# Patient Record
Sex: Male | Born: 1990 | Hispanic: Yes | Marital: Single | Smoking: Former smoker
Health system: Southern US, Community
[De-identification: ages and names within clinical notes are randomized; demographics above are authoritative.]

## PROBLEM LIST (undated history)

## (undated) HISTORY — PX: EYE SURGERY: SHX253

---
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
## 2011-11-10 ENCOUNTER — Emergency Department: Payer: Self-pay | Admitting: *Deleted

## 2011-11-10 LAB — URINALYSIS, COMPLETE
Bilirubin,UR: NEGATIVE
Glucose,UR: NEGATIVE mg/dL (ref 0–75)
Ketone: NEGATIVE
Protein: NEGATIVE
Specific Gravity: 1.027 (ref 1.003–1.030)
Squamous Epithelial: NONE SEEN
WBC UR: <1 /HPF (ref 0–5)

## 2011-11-10 LAB — CBC
MCH: 29.9 pg (ref 26.0–34.0)
MCHC: 33.4 g/dL (ref 32.0–36.0)
MCV: 89 fL (ref 80–100)
Platelet: 159 10*3/uL (ref 150–440)
RBC: 4.65 10*6/uL (ref 4.40–5.90)
RDW: 12.4 % (ref 11.5–14.5)
WBC: 6.3 10*3/uL (ref 3.8–10.6)

## 2011-11-10 LAB — COMPREHENSIVE METABOLIC PANEL
Albumin: 4.8 g/dL (ref 3.4–5.0)
BUN: 12 mg/dL (ref 7–18)
Calcium, Total: 8.8 mg/dL (ref 8.5–10.1)
Co2: 29 mmol/L (ref 21–32)
Creatinine: 0.75 mg/dL (ref 0.60–1.30)
EGFR (Non-African Amer.): >60
Osmolality: 280 (ref 275–301)
Potassium: 3.4 mmol/L — ABNORMAL LOW (ref 3.5–5.1)
SGOT(AST): 20 U/L (ref 15–37)
SGPT (ALT): 20 U/L

## 2011-11-10 LAB — LIPASE, BLOOD: Lipase: 92 U/L (ref 73–393)

## 2011-11-28 LAB — HM HIV SCREENING LAB: HM HIV Screening: NEGATIVE

## 2013-10-01 IMAGING — CT CT ABD-PELV W/ CM
1 of 2 series · 15 of 32 positions shown, 19 images · non-contrast
Comparison: none

REASON FOR EXAM: (1) RLQ pain, n/v; (2) RLQ pain, n/v
COMMENTS:

[Series 2: 3mm soft tissue · axial · 0.66mm/px · z∈[-764,-302]mm · 15 of 170 slices shown, 19 images]
[im 8/170  soft-tissue]
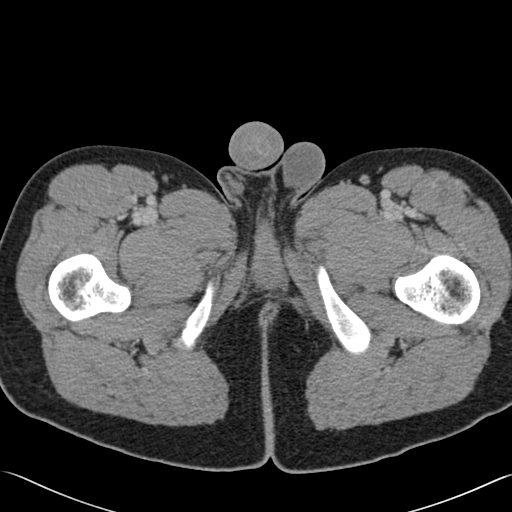
[im 8/170  bone]
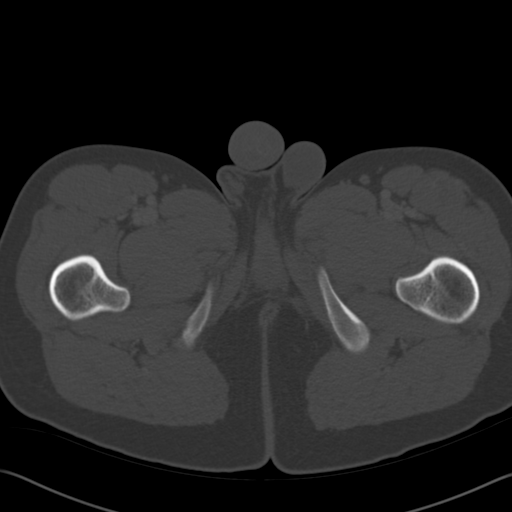
[im 22/170  soft-tissue]
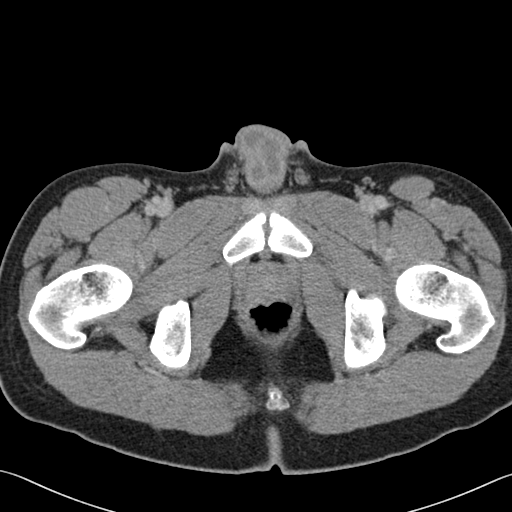
[im 36/170  soft-tissue]
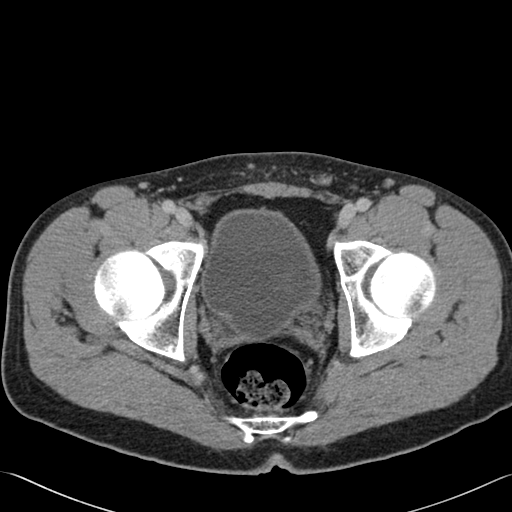
[im 50/170  soft-tissue]
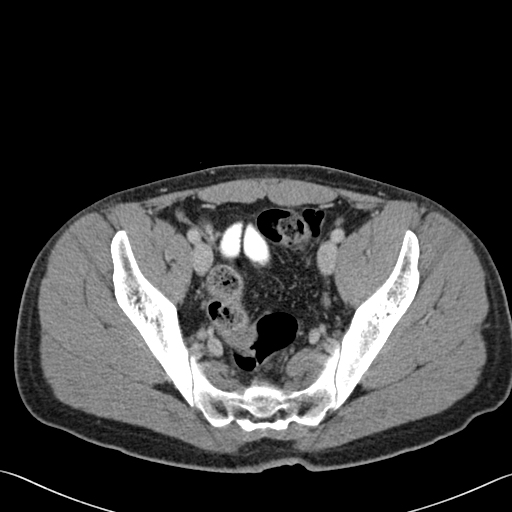
[im 57/170  soft-tissue]
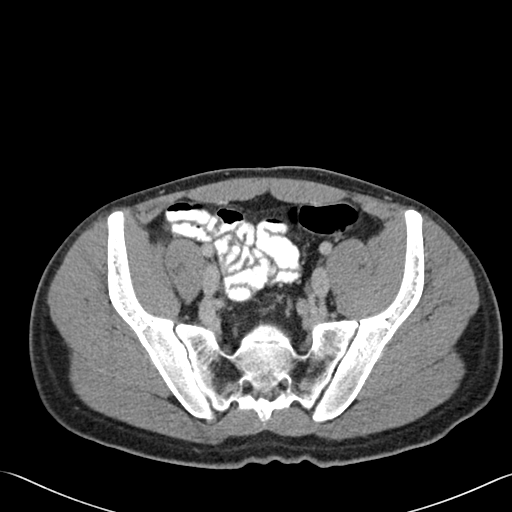
[im 71/170  soft-tissue]
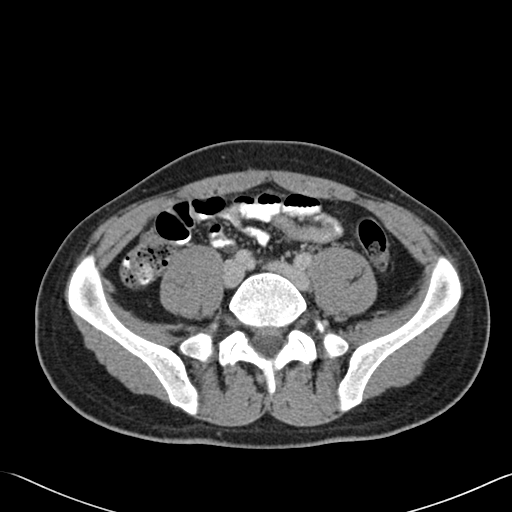
[im 85/170  soft-tissue]
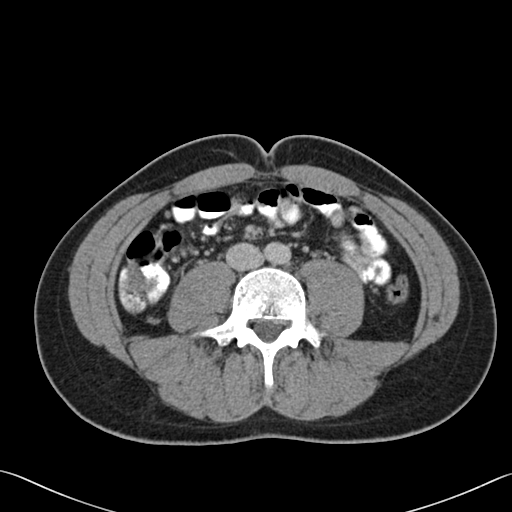
[im 99/170  soft-tissue]
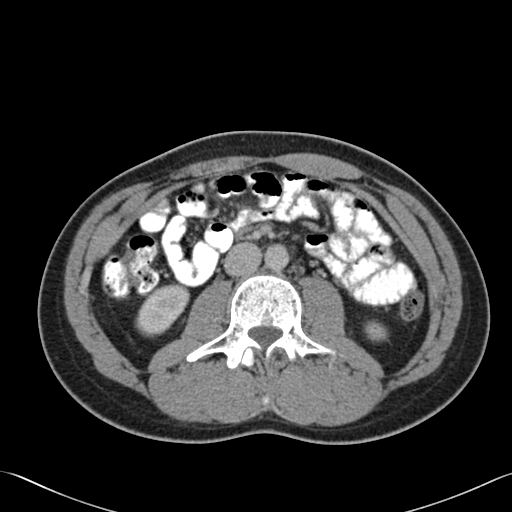
[im 113/170  soft-tissue]
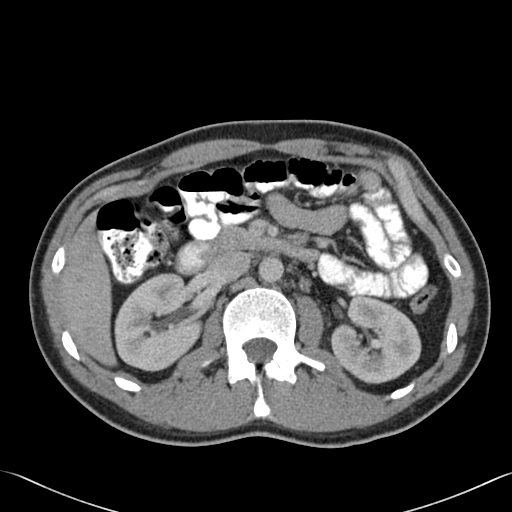
[im 113/170  bone]
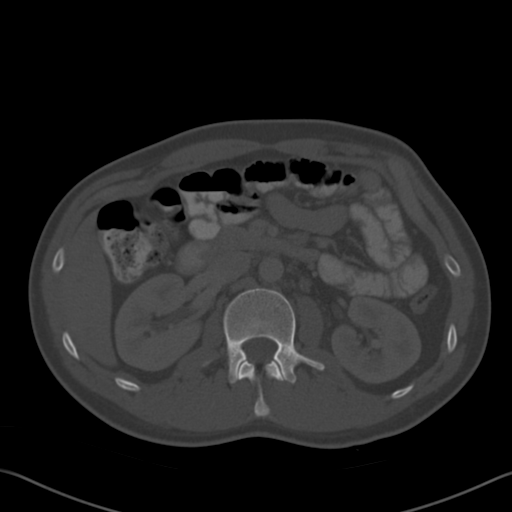
[im 120/170  soft-tissue]
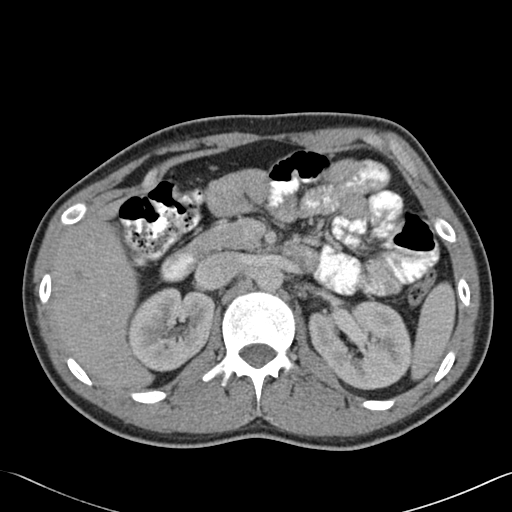
[im 134/170  soft-tissue]
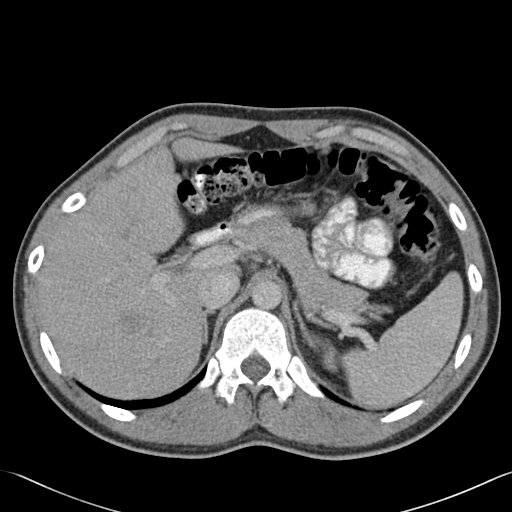
[im 141/170  lung]
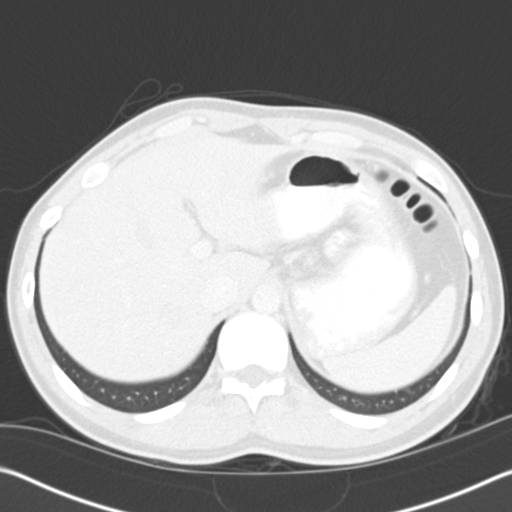
[im 148/170  soft-tissue]
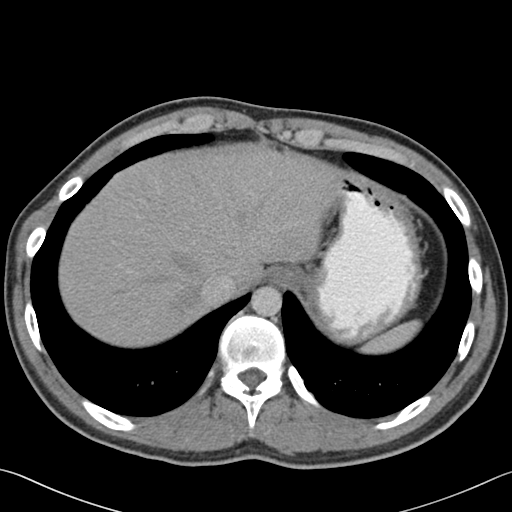
[im 148/170  lung]
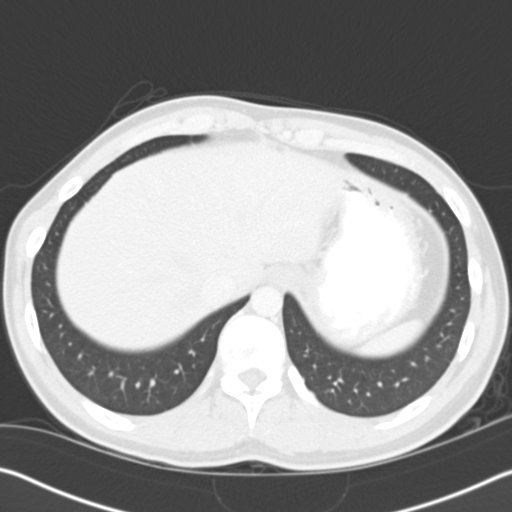
[im 155/170  lung]
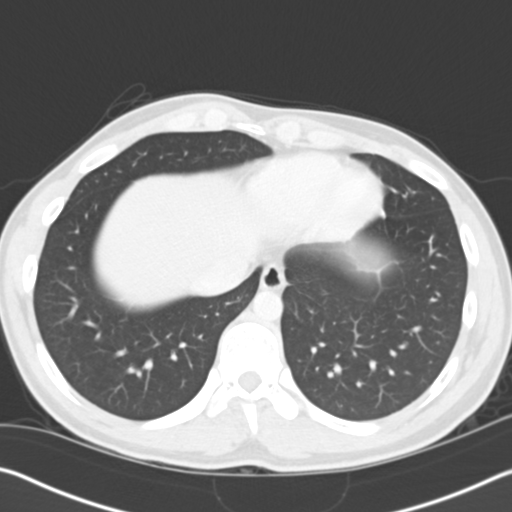
[im 162/170  soft-tissue]
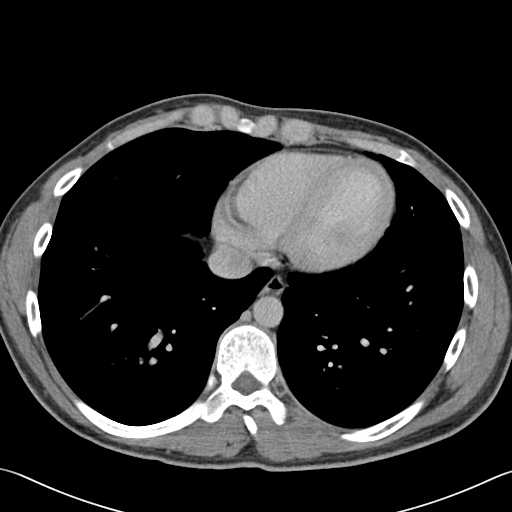
[im 162/170  lung]
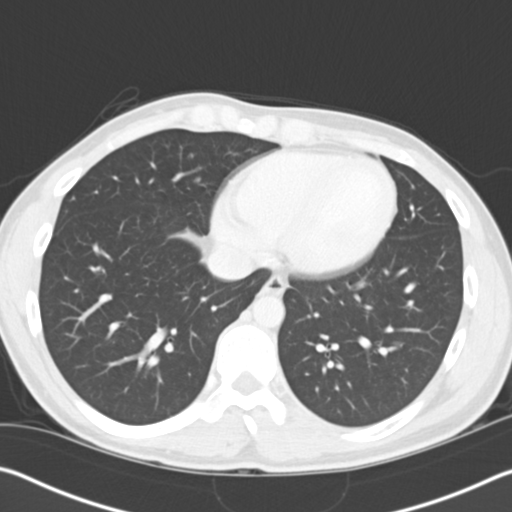

[15 of 32 positions shown; findings below may reference images not displayed]

PROCEDURE:     CT  - CT ABDOMEN / PELVIS  W  - November 10, 2011  [DATE]

RESULT:     Emergent CT of the abdomen and pelvis is performed with 85 mL of
Hsovue-DSS iodinated intravenous contrast and oral contrast. Images are
reconstructed at 3.0 mm slice thickness in the axial plane. There is no
previous exam for comparison.

Images through the base of the lungs demonstrate normal aeration. The liver,
spleen, pancreas, adrenal glands, gallbladder, kidneys and abdominal aorta
appear within normal limits. The urinary bladder is unremarkable. The
prostate is not enlarged. The appendix appears normal. There is slight
thickening of the wall of the descending colon but the colon is not
completely distended. This could be artifactual. Correlate for descending
colon colitis. There is a moderately large amount of fecal material within
the sigmoid colon. No bowel obstruction or adenopathy is evident. The bowel
is otherwise unremarkable.
IMPRESSION: Cannot exclude mild thickening of the wall of the
descending colon. Correlate clinically for colitis. Normal-appearing
appendix. No other significant abnormality demonstrated.

## 2016-08-22 DIAGNOSIS — Z6827 Body mass index (BMI) 27.0-27.9, adult: Secondary | ICD-10-CM | POA: Diagnosis not present

## 2016-08-22 DIAGNOSIS — Z713 Dietary counseling and surveillance: Secondary | ICD-10-CM | POA: Diagnosis not present

## 2016-08-22 DIAGNOSIS — Z136 Encounter for screening for cardiovascular disorders: Secondary | ICD-10-CM | POA: Diagnosis not present

## 2016-08-22 DIAGNOSIS — Z23 Encounter for immunization: Secondary | ICD-10-CM | POA: Diagnosis not present

## 2016-08-22 DIAGNOSIS — Z13220 Encounter for screening for lipoid disorders: Secondary | ICD-10-CM | POA: Diagnosis not present

## 2016-09-26 DIAGNOSIS — R809 Proteinuria, unspecified: Secondary | ICD-10-CM | POA: Diagnosis not present

## 2016-09-26 DIAGNOSIS — M545 Low back pain: Secondary | ICD-10-CM | POA: Diagnosis not present

## 2016-09-26 DIAGNOSIS — Z1389 Encounter for screening for other disorder: Secondary | ICD-10-CM | POA: Diagnosis not present

## 2017-08-28 DIAGNOSIS — Z713 Dietary counseling and surveillance: Secondary | ICD-10-CM | POA: Diagnosis not present

## 2017-08-28 DIAGNOSIS — Z136 Encounter for screening for cardiovascular disorders: Secondary | ICD-10-CM | POA: Diagnosis not present

## 2017-08-28 DIAGNOSIS — Z13220 Encounter for screening for lipoid disorders: Secondary | ICD-10-CM | POA: Diagnosis not present

## 2018-02-16 ENCOUNTER — Encounter: Payer: Self-pay | Admitting: Family Medicine

## 2018-02-16 ENCOUNTER — Telehealth: Payer: Self-pay | Admitting: Family Medicine

## 2018-02-16 ENCOUNTER — Ambulatory Visit: Payer: Self-pay | Admitting: Family Medicine

## 2018-02-16 ENCOUNTER — Other Ambulatory Visit: Payer: Self-pay | Admitting: Family Medicine

## 2018-02-16 VITALS — BP 116/64 | HR 74 | Temp 98.4°F | Resp 16 | Ht 71.0 in | Wt 205.0 lb

## 2018-02-16 DIAGNOSIS — Z7689 Persons encountering health services in other specified circumstances: Secondary | ICD-10-CM

## 2018-02-16 DIAGNOSIS — Z0000 Encounter for general adult medical examination without abnormal findings: Secondary | ICD-10-CM

## 2018-02-16 DIAGNOSIS — J3089 Other allergic rhinitis: Secondary | ICD-10-CM | POA: Insufficient documentation

## 2018-02-16 NOTE — Assessment & Plan Note (Signed)
Concern for persistent moderate to severe environmental allergies Spring/Fall Failed most conservative treatments: oral anti-histamines (Claritin, Zyrtec, Allegra), nasal steroid and sprays (Nasocort, Flonase)  Plan Referral to [facility] for further evaluation and testing - consider immunotherapy injections - or other treatment - I have offered Singulair as a possibility, may consider this in future, pending allergy eval

## 2018-02-16 NOTE — Patient Instructions (Addendum)
Thank you for coming to the office today.  Referral to Allergist - stay tuned for apt - if not heard back in 2 weeks then call them and if needed us  [facility] [address] [place], [place]  [id number] Phone: [phone]  May need allergy testing - and in future we discussed you can contact me if need and we can start Singulair (Montelukast) 10mg  nightly allergy pill and also asthma medicine, very safe, effective for seasonal allergies, it is good to take before season and continue through  Recommend Flu Shot at our clinic in Late September Early October - call to get nurse only visit for Flu Vaccine  DUE for FASTING BLOOD WORK (no food or drink after midnight before the lab appointment, only water or coffee without cream/sugar on the morning of)  SCHEDULE "Lab Only" visit in the morning at the clinic for lab draw in 6 MONTHS   - Make sure Lab Only appointment is at about 1 week before your next appointment, so that results will be available  For Lab Results, once available within 2-3 days of blood draw, you can can log in to MyChart online to view your results and a brief explanation. Also, we can discuss results at next follow-up visit.   Please schedule a Follow-up Appointment to: Return in about 6 months (around 08/19/2018) for Annual Physical.  If you have any other questions or concerns, please feel free to call the office or send a message through MyChart. You may also schedule an earlier appointment if necessary.  Additionally, you may be receiving a survey about your experience at our office within a few days to 1 week by e-mail or mail. We value your feedback.  Saralyn PilarAlexander Damaris Geers, DO [facility], [facility]

## 2018-02-16 NOTE — Progress Notes (Signed)
Subjective:    Patient ID: Jeffery Krueger, male    DOB: [birth year], 28 y.o.   MRN: [id number]  Jeffery Krueger is a 27 y.o. male presenting on 02/16/2018 for Establish Care and Seasonal Allergies  Here for new patient evaluation. He has not had a regular PCP for long time, previously years ago in [facility] Now he has been using [facility] for yearly check-up for required work physical, last done 08/2017.  HPI   Environmental and Seasonal Allergies Reports chronic problem with allergies, worst in Spring and Fall. Symptoms with sneezing, watery itchy eyes, congestion. Rarely has sinus infection or other URI. He has tried several medications in past with Claritin, Zyrtec and then Allegra, has tried nasal sprays Nasocort and other OTC generic. Used these months in advance of allergy season without significant improvement. - He did receive a steroid or allergy injection in past at [facility] clinic, would be interested to see an Allergist - Never on Singulair before - No known history of Asthma or other respiratory complaint  Overweight BMI >28 / Lifestyle / Background History Reports overall tries to follow healthy lifestyle. No reported significant chronic medical conditions such as HTN, HLD, DM2 - not in family either. Lifestyle: Weight gain +2-3 lbs in several months. - Diet: Balanced. Limits eating out and fast food. No exclusions. Drinks mostly water. Occasional very rare soda. Some sm amt coffee most days. - Exercise: he remains active outdoors, walking, some hiking now. Used to play more sports such as baseball and soccer.  Tobacco Use, occasional / infrequent Smokes 1 cigarette approx 1 x weekly usually on weekend to relax. Only for past 3 years.  Additional Social History: - He is originally from [place]. Has been in this [place] region for >19 years. - Currently engaged, and plans for future wedding in next 2-3 years - He has a 11 year old daughter   Health  Maintenance: Will get yearly Flu vaccine in Fall/Winter 2019 - he will return for this. Usually gets vaccine, he skipped last vaccine in 2018.  Unsure last TDap - will review at physical  Depression screen [facility] 2/9 02/16/2018  Decreased Interest 0  Down, Depressed, Hopeless 0  PHQ - 2 Score 0    History reviewed. No pertinent past medical history. History reviewed. No pertinent surgical history. Social History   Socioeconomic History  . Marital status: Single    Spouse name: Not on file  . Number of children: 1  . Years of education: College  . Highest education level: Bachelor's degree (e.g., BA, AB, BS)  Occupational History  . Occupation: [employer]  Social Needs  . Financial resource strain: Not on file  . Food insecurity:    Worry: Not on file    Inability: Not on file  . Transportation needs:    Medical: Not on file    Non-medical: Not on file  Tobacco Use  . Smoking status: Current Some Day Smoker    Packs/day: 0.05    Types: Cigarettes  . Smokeless tobacco: Never Used  Substance and Sexual Activity  . Alcohol use: Yes    Alcohol/week: 3.0 oz    Types: 5 Cans of beer per week  . Drug use: Never  . Sexual activity: Not on file  Lifestyle  . Physical activity:    Days per week: Not on file    Minutes per session: Not on file  . Stress: Not on file  Relationships  . Social  connections:    Talks on phone: Not on file    Gets together: Not on file    Attends religious service: Not on file    Active member of club or organization: Not on file    Attends meetings of clubs or organizations: Not on file    Relationship status: Not on file  . Intimate partner violence:    Fear of current or ex partner: Not on file    Emotionally abused: Not on file    Physically abused: Not on file    Forced sexual activity: Not on file  Other Topics Concern  . Not on file  Social History Narrative  . Not on file   Family History  Problem Relation Age of Onset  .  Prostate cancer Neg Hx   . Colon cancer Neg Hx    No current outpatient medications on file prior to visit.   No current facility-administered medications on file prior to visit.     Review of Systems  Constitutional: Negative for activity change, appetite change, chills, diaphoresis, fatigue and fever.  HENT: Negative for congestion and hearing loss.   Eyes: Negative for visual disturbance.  Respiratory: Negative for cough, chest tightness, shortness of breath and wheezing.   Cardiovascular: Negative for chest pain, palpitations and leg swelling.  Gastrointestinal: Negative for abdominal pain, constipation, diarrhea, nausea and vomiting.  Genitourinary: Negative for dysuria, frequency and hematuria.  Musculoskeletal: Negative for arthralgias and neck pain.  Skin: Negative for rash.  Allergic/Immunologic: Positive for environmental allergies.  Neurological: Negative for dizziness, weakness, light-headedness, numbness and headaches.  Hematological: Negative for adenopathy.  Psychiatric/Behavioral: Negative for behavioral problems, dysphoric mood and sleep disturbance.   Per HPI unless specifically indicated above     Objective:    BP 116/64   Pulse 74   Temp 98.4 F (36.9 C) (Oral)   Resp 16   Ht 5\' 11"  (1.803 m)   Wt 205 lb (93 kg)   BMI 28.59 kg/m   Wt Readings from Last 3 Encounters:  02/16/18 205 lb (93 kg)    Physical Exam  Constitutional: He is oriented to person, place, and time. He appears well-developed and well-nourished. No distress.  Well-appearing, comfortable, cooperative  HENT:  Head: Normocephalic and atraumatic.  Mouth/Throat: Oropharynx is clear and moist.  Eyes: Conjunctivae are normal. Right eye exhibits no discharge. Left eye exhibits no discharge.  Neck: Normal range of motion. Neck supple. No thyromegaly present.  Cardiovascular: Normal rate, regular rhythm, normal heart sounds and intact distal pulses.  No murmur heard. Pulmonary/Chest: Effort  normal and breath sounds normal. No respiratory distress. He has no wheezes. He has no rales.  Musculoskeletal: Normal range of motion. He exhibits no edema.  Lymphadenopathy:    He has no cervical adenopathy.  Neurological: He is alert and oriented to person, place, and time.  Skin: Skin is warm and dry. No rash noted. He is not diaphoretic. No erythema.  Psychiatric: He has a normal mood and affect. His behavior is normal.  Well groomed, good eye contact, normal speech and thoughts  Nursing note and vitals reviewed.  Results for orders placed or performed in visit on 02/16/18  HM HIV SCREENING LAB  Result Value Ref Range   HM HIV Screening Negative - Validated       Assessment & Plan:   Problem List Items Addressed This Visit    Environmental and seasonal allergies - Primary    Concern for persistent moderate to severe environmental allergies  Spring/Fall Failed most conservative treatments: oral anti-histamines (Claritin, Zyrtec, Allegra), nasal steroid and sprays (Nasocort, Flonase)  Plan Referral to [facility] for further evaluation and testing - consider immunotherapy injections - or other treatment - I have offered Singulair as a possibility, may consider this in future, pending allergy eval      Relevant Orders   Ambulatory referral to Allergy    Other Visit Diagnoses    Encounter to establish care with new doctor        Reviewed outside records available   No orders of the defined types were placed in this encounter.  Orders Placed This Encounter  Procedures  . [facility] HIV SCREENING LAB    This external order was created through the Results Console.  . Ambulatory referral to Allergy    Referral Priority:   Routine    Referral Type:   Allergy Testing    Referral Reason:   Specialty Services Required    Referred to Provider:   Sidney Ace, MD    Requested Specialty:   Allergy    Number of Visits Requested:   1    Follow up plan: Return in about 6 months  (around 08/19/2018) for Annual Physical.  Future lab orders for 08/20/18  Saralyn Pilar, DO [facility] 02/16/2018, 2:57 PM

## 2018-08-20 ENCOUNTER — Other Ambulatory Visit: Payer: Self-pay

## 2018-08-24 ENCOUNTER — Other Ambulatory Visit: Payer: Self-pay

## 2018-08-24 DIAGNOSIS — Z0000 Encounter for general adult medical examination without abnormal findings: Secondary | ICD-10-CM

## 2018-08-27 ENCOUNTER — Encounter: Payer: Self-pay | Admitting: Family Medicine

## 2018-08-27 ENCOUNTER — Ambulatory Visit (INDEPENDENT_AMBULATORY_CARE_PROVIDER_SITE_OTHER): Payer: BLUE CROSS/BLUE SHIELD | Admitting: Family Medicine

## 2018-08-27 ENCOUNTER — Other Ambulatory Visit: Payer: BLUE CROSS/BLUE SHIELD

## 2018-08-27 VITALS — BP 129/71 | HR 64 | Temp 98.6°F | Resp 16 | Ht 71.0 in | Wt 214.0 lb

## 2018-08-27 DIAGNOSIS — J3089 Other allergic rhinitis: Secondary | ICD-10-CM | POA: Diagnosis not present

## 2018-08-27 DIAGNOSIS — Z0000 Encounter for general adult medical examination without abnormal findings: Secondary | ICD-10-CM | POA: Diagnosis not present

## 2018-08-27 DIAGNOSIS — Z23 Encounter for immunization: Secondary | ICD-10-CM | POA: Diagnosis not present

## 2018-08-27 NOTE — Patient Instructions (Addendum)
Thank you for coming to the office today.  Please schedule and return for a NURSE ONLY VISIT for VACCINE - Approximately around February - Need Tdap tetanus vaccine  Stay tuned for lab results on mychart.  Flu vaccine today  Please schedule a Follow-up Appointment to: Return in about 1 year (around 08/28/2019) for Annual Physical.  If you have any other questions or concerns, please feel free to call the office or send a message through MyChart. You may also schedule an earlier appointment if necessary.  Additionally, you may be receiving a survey about your experience at our office within a few days to 1 week by e-mail or mail. We value your feedback.  Saralyn Pilar, DO [facility], [place]

## 2018-08-27 NOTE — Assessment & Plan Note (Signed)
Concern for persistent moderate to severe environmental allergies Spring/Fall / Also with possible food allergy Failed most conservative treatments: oral anti-histamines (Claritin, Zyrtec, Allegra), nasal steroid and sprays (Nasocort, Flonase)  Plan - Recommend that he re-schedule new patient visit with [facility] for testing / consider immunotherapy - he will re-schedule this now in 2020, due to conflict could not schedule last time  Future consider Singulair

## 2018-08-27 NOTE — Progress Notes (Addendum)
Subjective:    Patient ID: Jeffery Krueger, male    DOB: 05/25/1991, 28 y.o.   MRN: [id number]  Jeffery Krueger is a 28 y.o. male presenting on 08/27/2018 for Annual Exam   HPI   Here for Annual Physical and Lab Review  Environmental and Seasonal Allergies Last visit referred to [facility] 02/2018 - Jeffery Krueger did not schedule due to timing conflict, Jeffery Krueger will call them again this year Stable chronic seasonal allergies, Spring / Fall worst. Typical allergic rhinitis / conjunctivitis symptoms - Jeffery Krueger uses OTC anti-histamine PRN, and nasal spray  Additionally Jeffery Krueger admits a new food allergy, uncertain which food trigger, thought something on a pizza last week, developed a brief episode of urticaria, only on face, resolved with benadryl.   Overweight BMI >29 / Lifestyle / Background History Reports overall tries to follow healthy lifestyle. Lifestyle: Weight up about 10 lbs in 6 months. - Diet: Balanced. Limits eating out and fast food. No exclusions. Drinks mostly water. Occasional very rare soda. Some sm amt coffee most days. - Exercise:  Jeffery Krueger still remains active outdoors, walking, some hiking now. Used to play more sports such as baseball and soccer.  Tobacco Use, occasional / infrequent Smokes 1 cigarette approx 1 x weekly usually on weekend to relax. Only for past 3 years. No change today. Jeffery Krueger is not ready to quit.   Health Maintenance: Due for Flu Shot, will receive today   Due for TDap, Jeffery Krueger will return in 1 month to receive this, prefers to not get 2 vaccine in 1 day  [facility] 2/9 08/27/2018 02/16/2018  Decreased Interest 0 0  Down, Depressed, Hopeless 0 0  PHQ - 2 Score 0 0    History reviewed. No pertinent past medical history. History reviewed. No pertinent surgical history. Social History   Socioeconomic History  . Marital status: Single    Spouse name: Not on file  . Number of children: 1  . Years of education: College  . Highest education level:  Bachelor's degree (e.g., BA, AB, BS)  Occupational History  . Occupation: [employer]  Social Needs  . Financial resource strain: Not on file  . Food insecurity:    Worry: Not on file    Inability: Not on file  . Transportation needs:    Medical: Not on file    Non-medical: Not on file  Tobacco Use  . Smoking status: Current Some Day Smoker    Packs/day: 0.05    Types: Cigarettes  . Smokeless tobacco: Current User  Substance and Sexual Activity  . Alcohol use: Yes    Alcohol/week: 5.0 standard drinks    Types: 5 Cans of beer per week  . Drug use: Never  . Sexual activity: Not on file  Lifestyle  . Physical activity:    Days per week: Not on file    Minutes per session: Not on file  . Stress: Not on file  Relationships  . Social connections:    Talks on phone: Not on file    Gets together: Not on file    Attends religious service: Not on file    Active member of club or organization: Not on file    Attends meetings of clubs or organizations: Not on file    Relationship status: Not on file  . Intimate partner violence:    Fear of current or ex partner: Not on file    Emotionally abused: Not on file    Physically abused:  Not on file    Forced sexual activity: Not on file  Other Topics Concern  . Not on file  Social History Narrative  . Not on file   Family History  Problem Relation Age of Onset  . Prostate cancer Neg Hx   . Colon cancer Neg Hx    No current outpatient medications on file prior to visit.   No current facility-administered medications on file prior to visit.     Review of Systems  Constitutional: Negative for activity change, appetite change, chills, diaphoresis, fatigue and fever.  HENT: Negative for congestion and hearing loss.   Eyes: Negative for visual disturbance.  Respiratory: Negative for apnea, cough, choking, chest tightness, shortness of breath and wheezing.   Cardiovascular: Negative for chest pain, palpitations and leg swelling.   Gastrointestinal: Negative for abdominal pain, anal bleeding, blood in stool, constipation, diarrhea, nausea and vomiting.  Endocrine: Negative for cold intolerance.  Genitourinary: Negative for decreased urine volume, difficulty urinating, dysuria, frequency, hematuria, testicular pain and urgency.  Musculoskeletal: Negative for arthralgias, back pain and neck pain.  Skin: Negative for rash.  Allergic/Immunologic: Negative for environmental allergies.  Neurological: Negative for dizziness, weakness, light-headedness, numbness and headaches.  Hematological: Negative for adenopathy.  Psychiatric/Behavioral: Negative for behavioral problems, dysphoric mood and sleep disturbance. The patient is not nervous/anxious.    Per HPI unless specifically indicated above     Objective:    BP 129/71   Pulse 64   Temp 98.6 F (37 C) (Oral)   Resp 16   Ht 5\' 11"  (1.803 m)   Wt 214 lb (97.1 kg)   BMI 29.85 kg/m   Wt Readings from Last 3 Encounters:  08/27/18 214 lb (97.1 kg)  02/16/18 205 lb (93 kg)    Physical Exam Vitals signs and nursing note reviewed.  Constitutional:      General: Jeffery Krueger is not in acute distress.    Appearance: Jeffery Krueger is well-developed. Jeffery Krueger is not diaphoretic.     Comments: Well-appearing, comfortable, cooperative  HENT:     Head: Normocephalic and atraumatic.     Comments: Frontal / maxillary sinuses non-tender. Nares patent without purulence or edema. Bilateral TMs clear without erythema, effusion or bulging. Oropharynx clear without erythema, exudates, edema or asymmetry. Eyes:     General:        Right eye: No discharge.        Left eye: No discharge.     Conjunctiva/sclera: Conjunctivae normal.     Pupils: Pupils are equal, round, and reactive to light.  Neck:     Musculoskeletal: Normal range of motion and neck supple.     Thyroid: No thyromegaly.  Cardiovascular:     Rate and Rhythm: Normal rate and regular rhythm.     Heart sounds: Normal heart sounds. No murmur.   Pulmonary:     Effort: Pulmonary effort is normal. No respiratory distress.     Breath sounds: Normal breath sounds. No wheezing or rales.  Abdominal:     General: Bowel sounds are normal. There is no distension.     Palpations: Abdomen is soft. There is no mass.     Tenderness: There is no abdominal tenderness.  Musculoskeletal: Normal range of motion.        General: No tenderness.     Comments: Upper / Lower Extremities: - Normal muscle tone, strength bilateral upper extremities 5/5, lower extremities 5/5  Lymphadenopathy:     Cervical: No cervical adenopathy.  Skin:    General: Skin  is warm and dry.     Findings: No erythema or rash.  Neurological:     Mental Status: Jeffery Krueger is alert and oriented to person, place, and time.     Comments: Distal sensation intact to light touch all extremities  Psychiatric:        Behavior: Behavior normal.     Comments: Well groomed, good eye contact, normal speech and thoughts    Results for orders placed or performed in visit on 02/16/18  [facility] HIV SCREENING LAB  Result Value Ref Range   HM HIV Screening Negative - Validated       Assessment & Plan:   Problem List Items Addressed This Visit    Environmental and seasonal allergies    Concern for persistent moderate to severe environmental allergies Spring/Fall / Also with possible food allergy Failed most conservative treatments: oral anti-histamines (Claritin, Zyrtec, Allegra), nasal steroid and sprays (Nasocort, Flonase)  Plan - Recommend that Jeffery Krueger re-schedule new patient visit with [facility] for testing / consider immunotherapy - Jeffery Krueger will re-schedule this now in 2020, due to conflict could not schedule last time  Future consider Singulair       Other Visit Diagnoses    Annual physical exam    -  Primary   Flu vaccine need       Relevant Orders   Flu Vaccine QUAD 36+ mos IM (Completed)      Updated Health Maintenance information Flu vaccine today Return in 1 month for  TDap Labs drawn today, pending results, will notify patient Encouraged improvement to lifestyle with diet and exercise - Goal of weight loss  Will complete biometric form for employer after lab results available, fax back and notify patient form is ready  No orders of the defined types were placed in this encounter.   Follow up plan: Return in about 1 year (around 08/28/2019) for Annual Physical.  Future labs to be ordered once review current lab results. An[place]iapte 08/2019  Saralyn PilarAlexander Daion Ginsberg, DO [facility] 08/27/2018, 8:18 AM

## 2018-08-28 LAB — CBC WITH DIFFERENTIAL/PLATELET
Absolute Monocytes: 502 cells/uL (ref 200–950)
Basophils Absolute: 22 cells/uL (ref 0–200)
Basophils Relative: 0.4 %
EOS PCT: 1.1 %
Eosinophils Absolute: 59 cells/uL (ref 15–500)
HCT: 40.8 % (ref 38.5–50.0)
Hemoglobin: 14.3 g/dL (ref 13.2–17.1)
LYMPHS ABS: 1647 {cells}/uL (ref 850–3900)
MCH: 30.4 pg (ref 27.0–33.0)
MCHC: 35 g/dL (ref 32.0–36.0)
MCV: 86.6 fL (ref 80.0–100.0)
MONOS PCT: 9.3 %
MPV: 11 fL (ref 7.5–12.5)
NEUTROS PCT: 58.7 %
Neutro Abs: 3170 cells/uL (ref 1500–7800)
Platelets: 206 10*3/uL (ref 140–400)
RBC: 4.71 10*6/uL (ref 4.20–5.80)
RDW: 12 % (ref 11.0–15.0)
TOTAL LYMPHOCYTE: 30.5 %
WBC: 5.4 10*3/uL (ref 3.8–10.8)

## 2018-08-28 LAB — COMPLETE METABOLIC PANEL WITH GFR
AG RATIO: 1.9 (calc) (ref 1.0–2.5)
ALT: 21 U/L (ref 9–46)
AST: 15 U/L (ref 10–40)
Albumin: 4.5 g/dL (ref 3.6–5.1)
Alkaline phosphatase (APISO): 92 U/L (ref 40–115)
BILIRUBIN TOTAL: 0.4 mg/dL (ref 0.2–1.2)
BUN: 10 mg/dL (ref 7–25)
CHLORIDE: 106 mmol/L (ref 98–110)
CO2: 25 mmol/L (ref 20–32)
Calcium: 8.9 mg/dL (ref 8.6–10.3)
Creat: 0.82 mg/dL (ref 0.60–1.35)
GFR, EST AFRICAN AMERICAN: 139 mL/min/{1.73_m2} (ref >=60)
GFR, Est Non African American: 120 mL/min/{1.73_m2} (ref >=60)
Globulin: 2.4 g/dL (calc) (ref 1.9–3.7)
Glucose, Bld: 93 mg/dL (ref 65–99)
POTASSIUM: 4.3 mmol/L (ref 3.5–5.3)
Sodium: 138 mmol/L (ref 135–146)
Total Protein: 6.9 g/dL (ref 6.1–8.1)

## 2018-08-28 LAB — LIPID PANEL
CHOLESTEROL: 126 mg/dL (ref <200)
HDL: 43 mg/dL (ref >40)
LDL Cholesterol (Calc): 70 mg/dL (calc)
NON-HDL CHOLESTEROL (CALC): 83 mg/dL (ref <130)
Total CHOL/HDL Ratio: 2.9 (calc) (ref <5.0)
Triglycerides: 58 mg/dL (ref <150)

## 2018-08-28 LAB — HEMOGLOBIN A1C
HEMOGLOBIN A1C: 5.2 %{Hb} (ref <5.7)
Mean Plasma Glucose: 103 (calc)
eAG (mmol/L): 5.7 (calc)

## 2018-09-16 ENCOUNTER — Other Ambulatory Visit: Payer: Self-pay | Admitting: Family Medicine

## 2018-09-16 DIAGNOSIS — Z0000 Encounter for general adult medical examination without abnormal findings: Secondary | ICD-10-CM

## 2019-08-22 ENCOUNTER — Other Ambulatory Visit: Payer: BC Managed Care – PPO

## 2019-08-22 ENCOUNTER — Other Ambulatory Visit: Payer: Self-pay

## 2019-08-22 DIAGNOSIS — Z0000 Encounter for general adult medical examination without abnormal findings: Secondary | ICD-10-CM | POA: Diagnosis not present

## 2019-08-22 DIAGNOSIS — R739 Hyperglycemia, unspecified: Secondary | ICD-10-CM | POA: Diagnosis not present

## 2019-08-23 LAB — COMPLETE METABOLIC PANEL WITH GFR
AG Ratio: 1.7 (calc) (ref 1.0–2.5)
ALT: 22 U/L (ref 9–46)
AST: 16 U/L (ref 10–40)
Albumin: 4.3 g/dL (ref 3.6–5.1)
Alkaline phosphatase (APISO): 77 U/L (ref 36–130)
BUN: 12 mg/dL (ref 7–25)
CO2: 25 mmol/L (ref 20–32)
Calcium: 8.9 mg/dL (ref 8.6–10.3)
Chloride: 103 mmol/L (ref 98–110)
Creat: 0.79 mg/dL (ref 0.60–1.35)
GFR, Est African American: 141 mL/min/{1.73_m2} (ref >=60)
GFR, Est Non African American: 121 mL/min/{1.73_m2} (ref >=60)
Globulin: 2.6 g/dL (calc) (ref 1.9–3.7)
Glucose, Bld: 116 mg/dL — ABNORMAL HIGH (ref 65–99)
Potassium: 3.9 mmol/L (ref 3.5–5.3)
Sodium: 136 mmol/L (ref 135–146)
Total Bilirubin: 0.6 mg/dL (ref 0.2–1.2)
Total Protein: 6.9 g/dL (ref 6.1–8.1)

## 2019-08-23 LAB — CBC WITH DIFFERENTIAL/PLATELET
Absolute Monocytes: 308 cells/uL (ref 200–950)
Basophils Absolute: 22 cells/uL (ref 0–200)
Basophils Relative: 0.4 %
Eosinophils Absolute: 83 cells/uL (ref 15–500)
Eosinophils Relative: 1.5 %
HCT: 42.3 % (ref 38.5–50.0)
Hemoglobin: 14.4 g/dL (ref 13.2–17.1)
Lymphs Abs: 1513 cells/uL (ref 850–3900)
MCH: 29.8 pg (ref 27.0–33.0)
MCHC: 34 g/dL (ref 32.0–36.0)
MCV: 87.4 fL (ref 80.0–100.0)
MPV: 11.2 fL (ref 7.5–12.5)
Monocytes Relative: 5.6 %
Neutro Abs: 3575 cells/uL (ref 1500–7800)
Neutrophils Relative %: 65 %
Platelets: 196 10*3/uL (ref 140–400)
RBC: 4.84 10*6/uL (ref 4.20–5.80)
RDW: 12 % (ref 11.0–15.0)
Total Lymphocyte: 27.5 %
WBC: 5.5 10*3/uL (ref 3.8–10.8)

## 2019-08-23 LAB — LIPID PANEL
Cholesterol: 196 mg/dL (ref <200)
HDL: 46 mg/dL (ref >=40)
LDL Cholesterol (Calc): 131 mg/dL (calc) — ABNORMAL HIGH
Non-HDL Cholesterol (Calc): 150 mg/dL (calc) — ABNORMAL HIGH (ref <130)
Total CHOL/HDL Ratio: 4.3 (calc) (ref <5.0)
Triglycerides: 91 mg/dL (ref <150)

## 2019-08-26 ENCOUNTER — Other Ambulatory Visit: Payer: Self-pay | Admitting: Family Medicine

## 2019-08-26 ENCOUNTER — Other Ambulatory Visit: Payer: Self-pay

## 2019-08-26 ENCOUNTER — Encounter: Payer: Self-pay | Admitting: Family Medicine

## 2019-08-26 ENCOUNTER — Ambulatory Visit (INDEPENDENT_AMBULATORY_CARE_PROVIDER_SITE_OTHER): Payer: BC Managed Care – PPO | Admitting: Family Medicine

## 2019-08-26 VITALS — BP 114/75 | HR 85 | Temp 100.5°F | Resp 16 | Ht 71.0 in | Wt 228.0 lb

## 2019-08-26 DIAGNOSIS — E7800 Pure hypercholesterolemia, unspecified: Secondary | ICD-10-CM

## 2019-08-26 DIAGNOSIS — Z0000 Encounter for general adult medical examination without abnormal findings: Secondary | ICD-10-CM

## 2019-08-26 DIAGNOSIS — R7309 Other abnormal glucose: Secondary | ICD-10-CM | POA: Diagnosis not present

## 2019-08-26 DIAGNOSIS — E669 Obesity, unspecified: Secondary | ICD-10-CM

## 2019-08-26 LAB — POCT GLYCOSYLATED HEMOGLOBIN (HGB A1C): Hemoglobin A1C: 5.2 % (ref 4.0–5.6)

## 2019-08-26 NOTE — Patient Instructions (Addendum)
Thank you for coming to the office today.  Elevated fasting glucose at 116. Concerned but your 3 month average was normal again at 5.2, same as last year.  Keep up the good work  Goal to reduce weight with diet exercise in the future to get back on track.  Elevated LDL cholesterol to 131, previously 70s, goal to re-check this in 6 months.  Recent Labs    08/27/18 0758  HGBA1C 5.2   Lipid Panel     Component Value Date/Time   CHOL 196 08/22/2019 0756   TRIG 91 08/22/2019 0756   HDL 46 08/22/2019 0756   CHOLHDL 4.3 08/22/2019 0756   LDLCALC 131 (H) 08/22/2019 0756     Chemistry      Component Value Date/Time   NA 136 08/22/2019 0756   NA 141 11/10/2011 1759   K 3.9 08/22/2019 0756   K 3.4 (L) 11/10/2011 1759   CL 103 08/22/2019 0756   CL 103 11/10/2011 1759   CO2 25 08/22/2019 0756   CO2 29 11/10/2011 1759   BUN 12 08/22/2019 0756   BUN 12 11/10/2011 1759   CREATININE 0.79 08/22/2019 0756      Component Value Date/Time   CALCIUM 8.9 08/22/2019 0756   CALCIUM 8.8 11/10/2011 1759   ALKPHOS 75 11/10/2011 1759   AST 16 08/22/2019 0756   AST 20 11/10/2011 1759   ALT 22 08/22/2019 0756   ALT 20 11/10/2011 1759   BILITOT 0.6 08/22/2019 0756   BILITOT 0.4 11/10/2011 1759      DUE for FASTING BLOOD WORK (no food or drink after midnight before the lab appointment, only water or coffee without cream/sugar on the morning of)  SCHEDULE "Lab Only" visit in the morning at the clinic for lab draw in 6 MONTHS   - Make sure Lab Only appointment is at about 1 week before your next appointment, so that results will be available  For Lab Results, once available within 2-3 days of blood draw, you can can log in to MyChart online to view your results and a brief explanation. Also, we can discuss results at next follow-up visit.    Please schedule a Follow-up Appointment to: Return in about 6 months (around 02/23/2020) for 6 month lab results Cholesterol / Glucose / A1c / Weight  gan.  If you have any other questions or concerns, please feel free to call the office or send a message through MyChart. You may also schedule an earlier appointment if necessary.  Additionally, you may be receiving a survey about your experience at our office within a few days to 1 week by e-mail or mail. We value your feedback.  Saralyn Pilar, DO [facility] [place], [place]

## 2019-08-26 NOTE — Progress Notes (Signed)
Subjective:    Patient ID: Jeffery Krueger, male    DOB: [birth year], 29 y.o.   MRN: [id number]  Jeffery Krueger is a 29 y.o. male presenting on 08/26/2019 for Annual Exam   HPI   Here for Annual Physical and Lab Review  Obesity BMI >31 / Elevated LDL / Impaired Fasting Glucose / Lifestyle / Background History Reports overall tries to eat healthy and previously exercised more, but this past year due to COVID restrictions he has not followed healthy lifestyle as often Lab results - Elevated glucose 116 - Elevated LDL 70 to 131 Lifestyle: Weight up about 14 lbs in 1 year - Diet: increased fast food, convenient foods due to COVID restrictions, food supplied at work and not as much fresh foods. - Exercise:  Reduced activity, sedentary at work, not able to exercise as usual  Former Smoker - 02/2019   Health Maintenance: Declined Flu Shot today  Depression screen [facility] 2/9 08/26/2019 08/27/2018 02/16/2018  Decreased Interest 0 0 0  Down, Depressed, Hopeless 0 0 0  PHQ - 2 Score 0 0 0    History reviewed. No pertinent past medical history. History reviewed. No pertinent surgical history. Social History   Socioeconomic History  . Marital status: Single    Spouse name: Not on file  . Number of children: 1  . Years of education: College  . Highest education level: Bachelor's degree (e.g., BA, AB, BS)  Occupational History  . Occupation: [employer]  Tobacco Use  . Smoking status: Former Smoker    Packs/day: 0.05    Types: Cigarettes    Quit date: 02/06/2019    Years since quitting: 0.5  . Smokeless tobacco: Former Engineer, water and Sexual Activity  . Alcohol use: Yes    Alcohol/week: 5.0 standard drinks    Types: 5 Cans of beer per week  . Drug use: Never  . Sexual activity: Not on file  Other Topics Concern  . Not on file  Social History Narrative  . Not on file   Social Determinants of Health   Financial Resource Strain:   . Difficulty of Paying Living  Expenses: Not on file  Food Insecurity:   . Worried About Programme researcher, broadcasting/film/video in the Last Year: Not on file  . Ran Out of Food in the Last Year: Not on file  Transportation Needs:   . Lack of Transportation (Medical): Not on file  . Lack of Transportation (Non-Medical): Not on file  Physical Activity:   . Days of Exercise per Week: Not on file  . Minutes of Exercise per Session: Not on file  Stress:   . Feeling of Stress : Not on file  Social Connections:   . Frequency of Communication with Friends and Family: Not on file  . Frequency of Social Gatherings with Friends and Family: Not on file  . Attends Religious Services: Not on file  . Active Member of Clubs or Organizations: Not on file  . Attends Banker Meetings: Not on file  . Marital Status: Not on file  Intimate Partner Violence:   . Fear of Current or Ex-Partner: Not on file  . Emotionally Abused: Not on file  . Physically Abused: Not on file  . Sexually Abused: Not on file   Family History  Problem Relation Age of Onset  . Prostate cancer Neg Hx   . Colon cancer Neg Hx   . Diabetes Neg Hx    No current outpatient  medications on file prior to visit.   No current facility-administered medications on file prior to visit.    Review of Systems  Constitutional: Negative for activity change, appetite change, chills, diaphoresis, fatigue and fever.  HENT: Negative for congestion and hearing loss.   Eyes: Negative for visual disturbance.  Respiratory: Negative for apnea, cough, chest tightness, shortness of breath and wheezing.   Cardiovascular: Negative for chest pain, palpitations and leg swelling.  Gastrointestinal: Negative for abdominal pain, anal bleeding, blood in stool, constipation, diarrhea, nausea and vomiting.  Endocrine: Negative for cold intolerance.  Genitourinary: Negative for decreased urine volume, difficulty urinating, dysuria, frequency, hematuria and urgency.  Musculoskeletal: Negative  for arthralgias, back pain and neck pain.  Skin: Negative for rash.  Allergic/Immunologic: Negative for environmental allergies.  Neurological: Negative for dizziness, weakness, light-headedness, numbness and headaches.  Hematological: Negative for adenopathy.  Psychiatric/Behavioral: Negative for behavioral problems, dysphoric mood and sleep disturbance. The patient is not nervous/anxious.    Per HPI unless specifically indicated above      Objective:    BP 114/75   Pulse 85   Temp (!) 100.5 F (38.1 C) (Oral)   Resp 16   Ht 5\' 11"  (1.803 m)   Wt 228 lb (103.4 kg)   BMI 31.80 kg/m   Wt Readings from Last 3 Encounters:  08/26/19 228 lb (103.4 kg)  08/27/18 214 lb (97.1 kg)  02/16/18 205 lb (93 kg)    Physical Exam Vitals and nursing note reviewed.  Constitutional:      General: He is not in acute distress.    Appearance: He is well-developed. He is not diaphoretic.     Comments: Well-appearing, comfortable, cooperative  HENT:     Head: Normocephalic and atraumatic.  Eyes:     General:        Right eye: No discharge.        Left eye: No discharge.     Conjunctiva/sclera: Conjunctivae normal.     Pupils: Pupils are equal, round, and reactive to light.  Neck:     Thyroid: No thyromegaly.  Cardiovascular:     Rate and Rhythm: Normal rate and regular rhythm.     Heart sounds: Normal heart sounds. No murmur.  Pulmonary:     Effort: Pulmonary effort is normal. No respiratory distress.     Breath sounds: Normal breath sounds. No wheezing or rales.  Abdominal:     General: Bowel sounds are normal. There is no distension.     Palpations: Abdomen is soft. There is no mass.     Tenderness: There is no abdominal tenderness.  Musculoskeletal:        General: No tenderness. Normal range of motion.     Cervical back: Normal range of motion and neck supple.     Comments: Upper / Lower Extremities: - Normal muscle tone, strength bilateral upper extremities 5/5, lower  extremities 5/5  Lymphadenopathy:     Cervical: No cervical adenopathy.  Skin:    General: Skin is warm and dry.     Findings: No erythema or rash.  Neurological:     Mental Status: He is alert and oriented to person, place, and time.     Comments: Distal sensation intact to light touch all extremities  Psychiatric:        Behavior: Behavior normal.     Comments: Well groomed, good eye contact, normal speech and thoughts     Recent Labs    08/27/18 0758 08/26/19 1702  HGBA1C 5.2  5.2      Results for orders placed or performed in visit on 08/26/19  POCT glycosylated hemoglobin (Hb A1C)  Result Value Ref Range   Hemoglobin A1C 5.2 4.0 - 5.6 %      Assessment & Plan:   Problem List Items Addressed This Visit    None    Visit Diagnoses    Annual physical exam    -  Primary   Abnormal glucose       Relevant Orders   POCT glycosylated hemoglobin (Hb A1C) (Completed)   Elevated LDL cholesterol level       Obesity (BMI 30.0-34.9)          Updated Health Maintenance information Reviewed recent lab results with patient Encouraged improvement to lifestyle with diet and exercise Goal of weight loss  New abnormality now with elevated glucose, impaired fasting Check POC A1c today, result 5.2 reassuring same as last year 5.2, stable Also elevated LDL is abnormal, despite fasting lab Suspect may be related to his abnormal weight gain and poor lifestyle No significant fam history of these problems that he is aware of.  Completed his biometric page today and gave it back to him signed.  Recheck labs in 6 months fasting. BMET Lipid A1c  No orders of the defined types were placed in this encounter.     Follow up plan: Return in about 6 months (around 02/23/2020) for 6 month lab results Cholesterol / Glucose / A1c / Weight gan.  Saralyn Pilar, DO [facility] 08/26/2019, 2:08 PM

## 2020-01-07 DIAGNOSIS — Z23 Encounter for immunization: Secondary | ICD-10-CM | POA: Diagnosis not present

## 2020-02-06 DIAGNOSIS — Z23 Encounter for immunization: Secondary | ICD-10-CM | POA: Diagnosis not present

## 2020-09-23 ENCOUNTER — Other Ambulatory Visit: Payer: Self-pay | Admitting: Family Medicine

## 2020-09-23 ENCOUNTER — Other Ambulatory Visit: Payer: BC Managed Care – PPO

## 2020-09-23 ENCOUNTER — Other Ambulatory Visit: Payer: Self-pay

## 2020-09-23 DIAGNOSIS — E7800 Pure hypercholesterolemia, unspecified: Secondary | ICD-10-CM

## 2020-09-23 DIAGNOSIS — R7309 Other abnormal glucose: Secondary | ICD-10-CM | POA: Diagnosis not present

## 2020-09-23 DIAGNOSIS — Z0000 Encounter for general adult medical examination without abnormal findings: Secondary | ICD-10-CM | POA: Diagnosis not present

## 2020-09-23 DIAGNOSIS — E669 Obesity, unspecified: Secondary | ICD-10-CM

## 2020-09-24 LAB — COMPLETE METABOLIC PANEL WITH GFR
AG Ratio: 1.6 (calc) (ref 1.0–2.5)
ALT: 31 U/L (ref 9–46)
AST: 15 U/L (ref 10–40)
Albumin: 4.2 g/dL (ref 3.6–5.1)
Alkaline phosphatase (APISO): 82 U/L (ref 36–130)
BUN: 11 mg/dL (ref 7–25)
CO2: 25 mmol/L (ref 20–32)
Calcium: 8.8 mg/dL (ref 8.6–10.3)
Chloride: 104 mmol/L (ref 98–110)
Creat: 0.76 mg/dL (ref 0.60–1.35)
GFR, Est African American: 142 mL/min/{1.73_m2} (ref >=60)
GFR, Est Non African American: 122 mL/min/{1.73_m2} (ref >=60)
Globulin: 2.7 g/dL (calc) (ref 1.9–3.7)
Glucose, Bld: 101 mg/dL — ABNORMAL HIGH (ref 65–99)
Potassium: 4.1 mmol/L (ref 3.5–5.3)
Sodium: 138 mmol/L (ref 135–146)
Total Bilirubin: 0.6 mg/dL (ref 0.2–1.2)
Total Protein: 6.9 g/dL (ref 6.1–8.1)

## 2020-09-24 LAB — CBC WITH DIFFERENTIAL/PLATELET
Absolute Monocytes: 414 cells/uL (ref 200–950)
Basophils Absolute: 42 cells/uL (ref 0–200)
Basophils Relative: 0.7 %
Eosinophils Absolute: 60 cells/uL (ref 15–500)
Eosinophils Relative: 1 %
HCT: 41.3 % (ref 38.5–50.0)
Hemoglobin: 14.4 g/dL (ref 13.2–17.1)
Lymphs Abs: 1686 cells/uL (ref 850–3900)
MCH: 30.8 pg (ref 27.0–33.0)
MCHC: 34.9 g/dL (ref 32.0–36.0)
MCV: 88.2 fL (ref 80.0–100.0)
MPV: 11.9 fL (ref 7.5–12.5)
Monocytes Relative: 6.9 %
Neutro Abs: 3798 cells/uL (ref 1500–7800)
Neutrophils Relative %: 63.3 %
Platelets: 192 10*3/uL (ref 140–400)
RBC: 4.68 10*6/uL (ref 4.20–5.80)
RDW: 12 % (ref 11.0–15.0)
Total Lymphocyte: 28.1 %
WBC: 6 10*3/uL (ref 3.8–10.8)

## 2020-09-24 LAB — HEMOGLOBIN A1C
Hgb A1c MFr Bld: 5.2 % of total Hgb (ref <5.7)
Mean Plasma Glucose: 103 mg/dL
eAG (mmol/L): 5.7 mmol/L

## 2020-09-24 LAB — LIPID PANEL
Cholesterol: 169 mg/dL (ref <200)
HDL: 52 mg/dL (ref >=40)
LDL Cholesterol (Calc): 103 mg/dL (calc) — ABNORMAL HIGH
Non-HDL Cholesterol (Calc): 117 mg/dL (calc) (ref <130)
Total CHOL/HDL Ratio: 3.3 (calc) (ref <5.0)
Triglycerides: 57 mg/dL (ref <150)

## 2020-09-28 ENCOUNTER — Other Ambulatory Visit: Payer: Self-pay | Admitting: Family Medicine

## 2020-09-28 ENCOUNTER — Other Ambulatory Visit: Payer: Self-pay

## 2020-09-28 ENCOUNTER — Ambulatory Visit (INDEPENDENT_AMBULATORY_CARE_PROVIDER_SITE_OTHER): Payer: BC Managed Care – PPO | Admitting: Family Medicine

## 2020-09-28 ENCOUNTER — Encounter: Payer: Self-pay | Admitting: Family Medicine

## 2020-09-28 VITALS — BP 133/83 | HR 91 | Ht 71.0 in | Wt 226.0 lb

## 2020-09-28 DIAGNOSIS — J3089 Other allergic rhinitis: Secondary | ICD-10-CM

## 2020-09-28 DIAGNOSIS — Z0000 Encounter for general adult medical examination without abnormal findings: Secondary | ICD-10-CM

## 2020-09-28 DIAGNOSIS — M62830 Muscle spasm of back: Secondary | ICD-10-CM

## 2020-09-28 MED ORDER — CYCLOBENZAPRINE HCL 10 MG PO TABS: 10.0000 mg | ORAL_TABLET | Freq: Two times a day (BID) | ORAL | 2 refills | Status: DC | PRN

## 2020-09-28 NOTE — Patient Instructions (Addendum)
Thank you for coming to the office today.  Keep great up the work!  DUE for FASTING BLOOD WORK (no food or drink after midnight before the lab appointment, only water or coffee without cream/sugar on the morning of)  SCHEDULE "Lab Only" visit in the morning at the clinic for lab draw in 1 YEAR  - Make sure Lab Only appointment is at about 1 week before your next appointment, so that results will be available  For Lab Results, once available within 2-3 days of blood draw, you can can log in to MyChart online to view your results and a brief explanation. Also, we can discuss results at next follow-up visit.    Please schedule a Follow-up Appointment to: Return in about 1 year (around 09/28/2021) for 1 year fasting lab only then 1 week later Annual Physical.  If you have any other questions or concerns, please feel free to call the office or send a message through MyChart. You may also schedule an earlier appointment if necessary.  Additionally, you may be receiving a survey about your experience at our office within a few days to 1 week by e-mail or mail. We value your feedback.  Jeffery Pilar, DO [facility], [place]

## 2020-09-28 NOTE — Progress Notes (Signed)
Subjective:    Patient ID: Jeffery Krueger, male    DOB: 07-15-1991, 30 y.o.   MRN: [id number]  Jeffery Krueger is a 30 y.o. male presenting on 09/28/2020 for Annual Exam   HPI   Here for Annual Physical and Lab Review  Obesity BMI >31 / Elevated LDL / Impaired Fasting Glucose / Lifestyle / Background History Overall feels good and doing well with lifestyle improvement Lab results - Elevated glucose down to 101, from 116 - much improved, now normal - Elevated LDL down to 103 from 131, much improved Lifestyle: Weightup about 14 lbs in 1 year - Diet: Limited candy and sweets and soda. Still consuming some bread but limiting it some. - Exercise:  Significant improvement in exercise regimen and activity, some weight lifting at home occasionally, cardio, walking often with wife  Environmental Allergies Seasonal He is interested to see Allergist in future for allergy shots.  Former Smoker - 02/2019  History of COVID19 08/2020 had COVID. He had higher fever up to 101. He has respiratory symptoms and cough and aching and back pain. He has had some lingering back pain since then.   Health Maintenance: Declined Flu Shot today  Moderna Vaccine completed 02/2020, updated, no booster, will decline for now.   Depression screen [facility] 2/9 09/28/2020 08/26/2019 08/27/2018  Decreased Interest 0 0 0  Down, Depressed, Hopeless 0 0 0  PHQ - 2 Score 0 0 0    History reviewed. No pertinent past medical history. History reviewed. No pertinent surgical history. Social History   Socioeconomic History  . Marital status: Single    Spouse name: Not on file  . Number of children: 1  . Years of education: College  . Highest education level: Bachelor's degree (e.g., BA, AB, BS)  Occupational History  . Occupation: [employer]  Tobacco Use  . Smoking status: Former Smoker    Packs/day: 0.05    Types: Cigarettes    Quit date: 02/06/2019    Years since quitting: 1.6  . Smokeless tobacco:  Former Clinical biochemist  . Vaping Use: Never used  Substance and Sexual Activity  . Alcohol use: Yes    Alcohol/week: 5.0 standard drinks    Types: 5 Cans of beer per week  . Drug use: Never  . Sexual activity: Not on file  Other Topics Concern  . Not on file  Social History Narrative  . Not on file   Social Determinants of Health   Financial Resource Strain: Not on file  Food Insecurity: Not on file  Transportation Needs: Not on file  Physical Activity: Not on file  Stress: Not on file  Social Connections: Not on file  Intimate Partner Violence: Not on file   Family History  Problem Relation Age of Onset  . Prostate cancer Neg Hx   . Colon cancer Neg Hx   . Diabetes Neg Hx    No current outpatient medications on file prior to visit.   No current facility-administered medications on file prior to visit.    Review of Systems  Constitutional: Negative for activity change, appetite change, chills, diaphoresis, fatigue and fever.  HENT: Negative for congestion and hearing loss.   Eyes: Negative for visual disturbance.  Respiratory: Negative for cough, chest tightness, shortness of breath and wheezing.   Cardiovascular: Negative for chest pain, palpitations and leg swelling.  Gastrointestinal: Negative for abdominal pain, constipation, diarrhea, nausea and vomiting.  Genitourinary: Negative for dysuria, frequency and hematuria.  Musculoskeletal:  Positive for back pain. Negative for arthralgias and neck pain.  Skin: Negative for rash.  Allergic/Immunologic: Positive for environmental allergies.  Neurological: Negative for dizziness, weakness, light-headedness, numbness and headaches.  Hematological: Negative for adenopathy.  Psychiatric/Behavioral: Negative for behavioral problems, dysphoric mood and sleep disturbance.   Per HPI unless specifically indicated above     Objective:    BP 133/83   Pulse 91   Ht 5\' 11"  (1.803 m)   Wt 226 lb (102.5 kg)   SpO2 98%   BMI  31.52 kg/m   Wt Readings from Last 3 Encounters:  09/28/20 226 lb (102.5 kg)  08/26/19 228 lb (103.4 kg)  08/27/18 214 lb (97.1 kg)    Physical Exam Vitals and nursing note reviewed.  Constitutional:      General: He is not in acute distress.    Appearance: He is well-developed and well-nourished. He is not diaphoretic.     Comments: Well-appearing, comfortable, cooperative  HENT:     Head: Normocephalic and atraumatic.     Mouth/Throat:     Mouth: Oropharynx is clear and moist.  Eyes:     General:        Right eye: No discharge.        Left eye: No discharge.     Extraocular Movements: EOM normal.     Conjunctiva/sclera: Conjunctivae normal.     Pupils: Pupils are equal, round, and reactive to light.  Neck:     Thyroid: No thyromegaly.  Cardiovascular:     Rate and Rhythm: Normal rate and regular rhythm.     Pulses: Intact distal pulses.     Heart sounds: Normal heart sounds. No murmur heard.   Pulmonary:     Effort: Pulmonary effort is normal. No respiratory distress.     Breath sounds: Normal breath sounds. No wheezing or rales.  Abdominal:     General: Bowel sounds are normal. There is no distension.     Palpations: Abdomen is soft. There is no mass.     Tenderness: There is no abdominal tenderness.  Musculoskeletal:        General: No tenderness or edema. Normal range of motion.     Cervical back: Normal range of motion and neck supple.     Comments: Upper / Lower Extremities: - Normal muscle tone, strength bilateral upper extremities 5/5, lower extremities 5/5  Lymphadenopathy:     Cervical: No cervical adenopathy.  Skin:    General: Skin is warm and dry.     Findings: No erythema or rash.  Neurological:     Mental Status: He is alert and oriented to person, place, and time.     Comments: Distal sensation intact to light touch all extremities  Psychiatric:        Mood and Affect: Mood and affect normal.        Behavior: Behavior normal.     Comments: Well  groomed, good eye contact, normal speech and thoughts         Results for orders placed or performed in visit on 09/23/20  Hemoglobin A1c  Result Value Ref Range   Hgb A1c MFr Bld 5.2 <5.7 % of total Hgb   Mean Plasma Glucose 103 mg/dL   eAG (mmol/L) 5.7 mmol/L  CBC with Differential/Platelet  Result Value Ref Range   WBC 6.0 3.8 - 10.8 Thousand/uL   RBC 4.68 4.20 - 5.80 Million/uL   Hemoglobin 14.4 13.2 - 17.1 g/dL   HCT 09/25/20 93.7 - 16.9 %  MCV 88.2 80.0 - 100.0 fL   MCH 30.8 27.0 - 33.0 pg   MCHC 34.9 32.0 - 36.0 g/dL   RDW 75.1 70.0 - 17.4 %   Platelets 192 140 - 400 Thousand/uL   MPV 11.9 7.5 - 12.5 fL   Neutro Abs 3,798 1,500 - 7,800 cells/uL   Lymphs Abs 1,686 850 - 3,900 cells/uL   Absolute Monocytes 414 200 - 950 cells/uL   Eosinophils Absolute 60 15 - 500 cells/uL   Basophils Absolute 42 0 - 200 cells/uL   Neutrophils Relative % 63.3 %   Total Lymphocyte 28.1 %   Monocytes Relative 6.9 %   Eosinophils Relative 1.0 %   Basophils Relative 0.7 %  COMPLETE METABOLIC PANEL WITH GFR  Result Value Ref Range   Glucose, Bld 101 (H) 65 - 99 mg/dL   BUN 11 7 - 25 mg/dL   Creat 9.44 9.67 - 5.91 mg/dL   GFR, Est Non African American 122 > OR = 60 mL/min/1.27m2   GFR, Est African American 142 > OR = 60 mL/min/1.23m2   BUN/Creatinine Ratio NOT APPLICABLE 6 - 22 (calc)   Sodium 138 135 - 146 mmol/L   Potassium 4.1 3.5 - 5.3 mmol/L   Chloride 104 98 - 110 mmol/L   CO2 25 20 - 32 mmol/L   Calcium 8.8 8.6 - 10.3 mg/dL   Total Protein 6.9 6.1 - 8.1 g/dL   Albumin 4.2 3.6 - 5.1 g/dL   Globulin 2.7 1.9 - 3.7 g/dL (calc)   AG Ratio 1.6 1.0 - 2.5 (calc)   Total Bilirubin 0.6 0.2 - 1.2 mg/dL   Alkaline phosphatase (APISO) 82 36 - 130 U/L   AST 15 10 - 40 U/L   ALT 31 9 - 46 U/L  Lipid panel  Result Value Ref Range   Cholesterol 169 <200 mg/dL   HDL 52 > OR = 40 mg/dL   Triglycerides 57 <638 mg/dL   LDL Cholesterol (Calc) 103 (H) mg/dL (calc)   Total CHOL/HDL Ratio 3.3  <5.0 (calc)   Non-HDL Cholesterol (Calc) 117 <130 mg/dL (calc)      Assessment & Plan:   Problem List Items Addressed This Visit    Environmental and seasonal allergies    Other Visit Diagnoses    Annual physical exam    -  Primary      Updated Health Maintenance information Due for covid booster in the future when ready Reviewed recent lab results with patient Encouraged improvement to lifestyle with diet and exercise Goal of weight loss  Completed his work biometric form today returned to him   #Allergies May refer to Allergist for immunotherapy shots when ready.   No orders of the defined types were placed in this encounter.    Follow up plan: Return in about 1 year (around 09/28/2021) for 1 year fasting lab only then 1 week later Annual Physical.  Saralyn Pilar, DO [facility] 09/28/2020, 10:23 AM

## 2020-10-05 ENCOUNTER — Encounter: Payer: BC Managed Care – PPO | Admitting: Family Medicine

## 2021-08-20 ENCOUNTER — Other Ambulatory Visit: Payer: Self-pay | Admitting: Family Medicine

## 2021-08-20 DIAGNOSIS — E669 Obesity, unspecified: Secondary | ICD-10-CM

## 2021-08-20 DIAGNOSIS — E7800 Pure hypercholesterolemia, unspecified: Secondary | ICD-10-CM

## 2021-08-20 DIAGNOSIS — Z1159 Encounter for screening for other viral diseases: Secondary | ICD-10-CM

## 2021-08-20 DIAGNOSIS — Z0000 Encounter for general adult medical examination without abnormal findings: Secondary | ICD-10-CM

## 2021-08-20 DIAGNOSIS — R7309 Other abnormal glucose: Secondary | ICD-10-CM

## 2021-08-23 ENCOUNTER — Other Ambulatory Visit: Payer: Self-pay | Admitting: Family Medicine

## 2021-08-23 ENCOUNTER — Ambulatory Visit (INDEPENDENT_AMBULATORY_CARE_PROVIDER_SITE_OTHER): Payer: BC Managed Care – PPO | Admitting: Family Medicine

## 2021-08-23 ENCOUNTER — Encounter: Payer: Self-pay | Admitting: Family Medicine

## 2021-08-23 ENCOUNTER — Other Ambulatory Visit: Payer: BC Managed Care – PPO

## 2021-08-23 ENCOUNTER — Other Ambulatory Visit: Payer: Self-pay

## 2021-08-23 VITALS — BP 133/78 | HR 74 | Ht 71.0 in | Wt 240.2 lb

## 2021-08-23 DIAGNOSIS — Z0000 Encounter for general adult medical examination without abnormal findings: Secondary | ICD-10-CM | POA: Diagnosis not present

## 2021-08-23 DIAGNOSIS — E669 Obesity, unspecified: Secondary | ICD-10-CM | POA: Diagnosis not present

## 2021-08-23 DIAGNOSIS — R7309 Other abnormal glucose: Secondary | ICD-10-CM | POA: Diagnosis not present

## 2021-08-23 DIAGNOSIS — E7800 Pure hypercholesterolemia, unspecified: Secondary | ICD-10-CM

## 2021-08-23 DIAGNOSIS — Z23 Encounter for immunization: Secondary | ICD-10-CM

## 2021-08-23 DIAGNOSIS — E66811 Obesity, class 1: Secondary | ICD-10-CM | POA: Insufficient documentation

## 2021-08-23 DIAGNOSIS — Z1159 Encounter for screening for other viral diseases: Secondary | ICD-10-CM

## 2021-08-23 NOTE — Progress Notes (Signed)
Subjective:    Patient ID: Jeffery Krueger, male    DOB: July 25, 1991, 31 y.o.   MRN: [id number]  Jeffery Krueger is a 31 y.o. male presenting on 08/23/2021 for Annual Exam   HPI  Here for Annual Physical and Lab Review  Obesity BMI >33 / Elevated LDL / Impaired Fasting Glucose / Lifestyle / Background History Overall feels good and doing well with lifestyle improvement Lab results - Previously had improved glucose and cholesterol with wt loss Lifestyle: Weight up about 14 lbs in 1 year - Diet: Improving diet - Exercise:  Goal to resume cardio now, to work on weight, says has been inactive for about 4 months with recent newborn.   Environmental Allergies Seasonal He is interested to see Allergist in future for allergy shots.  Additional complaint 2 weeks ago has had some neck swelling symptoms, with some pain with rotation of neck, admits some sore throat difficulty with swallowing. Tried NyQuil without relief. Denies sinus drainage, cough   [facility] [place] [facility] - corrected vision, bilateral, no contacts.  Former Smoker - 02/2019   History of [place] 08/2020 had COVID. He had higher fever up to 101. He has respiratory symptoms and cough and aching and back pain. He has had some lingering back pain since then.    Health Maintenance: Declined Flu Shot today   Moderna Vaccine completed 02/2020, updated, no booster, will decline for now.  Depression screen [facility] 2/9 08/23/2021 09/28/2020 08/26/2019  Decreased Interest 1 0 0  Down, Depressed, Hopeless 1 0 0  PHQ - 2 Score 2 0 0  Altered sleeping 0 - -  Tired, decreased energy 0 - -  Change in appetite 0 - -  Feeling bad or failure about yourself  1 - -  Trouble concentrating 0 - -  Moving slowly or fidgety/restless 0 - -  Suicidal thoughts 0 - -  PHQ-9 Score 3 - -  Difficult doing work/chores Somewhat difficult - -    History reviewed. No pertinent past medical history. History reviewed. No pertinent surgical  history. Social History   Socioeconomic History   Marital status: Single    Spouse name: Not on file   Number of children: 1   Years of education: College   Highest education level: Bachelor's degree (e.g., BA, AB, BS)  Occupational History   Occupation: [employer]  Tobacco Use   Smoking status: Former    Packs/day: 0.05    Types: Cigarettes    Quit date: 02/06/2019    Years since quitting: 2.5   Smokeless tobacco: Former  Scientific laboratory technician Use: Never used  Substance and Sexual Activity   Alcohol use: Yes    Alcohol/week: 5.0 standard drinks    Types: 5 Cans of beer per week   Drug use: Never   Sexual activity: Not on file  Other Topics Concern   Not on file  Social History Narrative   Not on file   Social Determinants of Health   Financial Resource Strain: Not on file  Food Insecurity: Not on file  Transportation Needs: Not on file  Physical Activity: Not on file  Stress: Not on file  Social Connections: Not on file  Intimate Partner Violence: Not on file   Family History  Problem Relation Age of Onset   Prostate cancer Neg Hx    Colon cancer Neg Hx    Diabetes Neg Hx    No current outpatient medications on file prior to visit.  No current facility-administered medications on file prior to visit.    Review of Systems  Constitutional:  Negative for activity change, appetite change, chills, diaphoresis, fatigue and fever.  HENT:  Negative for congestion and hearing loss.   Eyes:  Negative for visual disturbance.  Respiratory:  Negative for cough, chest tightness, shortness of breath and wheezing.   Cardiovascular:  Negative for chest pain, palpitations and leg swelling.  Gastrointestinal:  Negative for abdominal pain, constipation, diarrhea, nausea and vomiting.  Genitourinary:  Negative for dysuria, frequency and hematuria.  Musculoskeletal:  Negative for arthralgias and neck pain.  Skin:  Negative for rash.  Neurological:  Negative for dizziness,  weakness, light-headedness, numbness and headaches.  Hematological:  Negative for adenopathy.  Psychiatric/Behavioral:  Negative for behavioral problems, dysphoric mood and sleep disturbance.   Per HPI unless specifically indicated above      Objective:    BP 133/78    Pulse 74    Ht 5\' 11"  (1.803 m)    Wt 240 lb 3.2 oz (109 kg)    SpO2 96%    BMI 33.50 kg/m   Wt Readings from Last 3 Encounters:  08/23/21 240 lb 3.2 oz (109 kg)  09/28/20 226 lb (102.5 kg)  08/26/19 228 lb (103.4 kg)    Physical Exam Vitals and nursing note reviewed.  Constitutional:      General: He is not in acute distress.    Appearance: He is well-developed. He is not diaphoretic.     Comments: Well-appearing, comfortable, cooperative  HENT:     Head: Normocephalic and atraumatic.     Mouth/Throat:     Pharynx: No oropharyngeal exudate or posterior oropharyngeal erythema.     Comments: Bilateral tonsils appears edematous or swollen non focal no lesions Eyes:     General:        Right eye: No discharge.        Left eye: No discharge.     Conjunctiva/sclera: Conjunctivae normal.     Pupils: Pupils are equal, round, and reactive to light.  Neck:     Thyroid: No thyromegaly.     Vascular: No carotid bruit.     Comments: Mild fullness of neck anterior, but no definitive LAD, some mild tender L > R Cardiovascular:     Rate and Rhythm: Normal rate and regular rhythm.     Pulses: Normal pulses.     Heart sounds: Normal heart sounds. No murmur heard. Pulmonary:     Effort: Pulmonary effort is normal. No respiratory distress.     Breath sounds: Normal breath sounds. No wheezing or rales.  Abdominal:     General: Bowel sounds are normal. There is no distension.     Palpations: Abdomen is soft. There is no mass.     Tenderness: There is no abdominal tenderness.  Musculoskeletal:        General: No tenderness. Normal range of motion.     Cervical back: Normal range of motion and neck supple.     Right lower  leg: No edema.     Left lower leg: No edema.     Comments: Upper / Lower Extremities: - Normal muscle tone, strength bilateral upper extremities 5/5, lower extremities 5/5  Lymphadenopathy:     Cervical: No cervical adenopathy.  Skin:    General: Skin is warm and dry.     Findings: No erythema or rash.  Neurological:     Mental Status: He is alert and oriented to person, place, and time.  Comments: Distal sensation intact to light touch all extremities  Psychiatric:        Mood and Affect: Mood normal.        Behavior: Behavior normal.        Thought Content: Thought content normal.     Comments: Well groomed, good eye contact, normal speech and thoughts    Results for orders placed or performed in visit on 09/23/20  Hemoglobin A1c  Result Value Ref Range   Hgb A1c MFr Bld 5.2 <5.7 % of total Hgb   Mean Plasma Glucose 103 mg/dL   eAG (mmol/L) 5.7 mmol/L  CBC with Differential/Platelet  Result Value Ref Range   WBC 6.0 3.8 - 10.8 Thousand/uL   RBC 4.68 4.20 - 5.80 Million/uL   Hemoglobin 14.4 13.2 - 17.1 g/dL   HCT 41.3 38.5 - 50.0 %   MCV 88.2 80.0 - 100.0 fL   MCH 30.8 27.0 - 33.0 pg   MCHC 34.9 32.0 - 36.0 g/dL   RDW 12.0 11.0 - 15.0 %   Platelets 192 140 - 400 Thousand/uL   MPV 11.9 7.5 - 12.5 fL   Neutro Abs 3,798 1,500 - 7,800 cells/uL   Lymphs Abs 1,686 850 - 3,900 cells/uL   Absolute Monocytes 414 200 - 950 cells/uL   Eosinophils Absolute 60 15 - 500 cells/uL   Basophils Absolute 42 0 - 200 cells/uL   Neutrophils Relative % 63.3 %   Total Lymphocyte 28.1 %   Monocytes Relative 6.9 %   Eosinophils Relative 1.0 %   Basophils Relative 0.7 %  COMPLETE METABOLIC PANEL WITH GFR  Result Value Ref Range   Glucose, Bld 101 (H) 65 - 99 mg/dL   BUN 11 7 - 25 mg/dL   Creat 0.76 0.60 - 1.35 mg/dL   GFR, Est Non African American 122 > OR = 60 mL/min/1.24m2   GFR, Est African American 142 > OR = 60 mL/min/1.78m2   BUN/Creatinine Ratio NOT APPLICABLE 6 - 22 (calc)    Sodium 138 135 - 146 mmol/L   Potassium 4.1 3.5 - 5.3 mmol/L   Chloride 104 98 - 110 mmol/L   CO2 25 20 - 32 mmol/L   Calcium 8.8 8.6 - 10.3 mg/dL   Total Protein 6.9 6.1 - 8.1 g/dL   Albumin 4.2 3.6 - 5.1 g/dL   Globulin 2.7 1.9 - 3.7 g/dL (calc)   AG Ratio 1.6 1.0 - 2.5 (calc)   Total Bilirubin 0.6 0.2 - 1.2 mg/dL   Alkaline phosphatase (APISO) 82 36 - 130 U/L   AST 15 10 - 40 U/L   ALT 31 9 - 46 U/L  Lipid panel  Result Value Ref Range   Cholesterol 169 <200 mg/dL   HDL 52 > OR = 40 mg/dL   Triglycerides 57 <150 mg/dL   LDL Cholesterol (Calc) 103 (H) mg/dL (calc)   Total CHOL/HDL Ratio 3.3 <5.0 (calc)   Non-HDL Cholesterol (Calc) 117 <130 mg/dL (calc)      Assessment & Plan:   Problem List Items Addressed This Visit     Obesity (BMI 30.0-34.9)   Other Visit Diagnoses     Annual physical exam    -  Primary   Need for diphtheria-tetanus-pertussis (Tdap) vaccine       Relevant Orders   Tdap vaccine greater than or equal to 7yo IM (Completed)       Updated Health Maintenance information Tdap Pending lab results, completed fasting labs this AM, results pending. Will complete /  fax his paperwork in when complete. Encouraged improvement to lifestyle with diet and exercise Goal of weight loss  Uncertain exact cause of neck swelling/discomfort vs sore throat enlarged tonsils Can be related to recent viral infection or considering allergies vs sinus drainage vs GERD LPR On allergy therapy Trial OTC PPI for possible Silent Reflux LPR  No orders of the defined types were placed in this encounter.   Follow up plan: Return in about 1 year (around 08/23/2022) for 1 year Annual Physical (prefers fasting lab only BEFORE PHYSICAL same day).  Nobie Putnam, [facility] 08/23/2021, 2:27 PM

## 2021-08-23 NOTE — Patient Instructions (Addendum)
Thank you for coming to the office today.  Keep up the great work. Goal to resume the cardio exercise.  Your symptoms sound most consistent with Silent Reflux or (Laryngopharyngeal Reflux), this is similar to Acid Reflux (or GERD) but usually involves the Throat and has a variety of symptoms including a "globus sensation", or air bubble or pressure in throat.   Commonly occurs in patients who have had some symptoms of traditional heartburn before, but this can occur even when not eating spicy foods. - Start the PPI medicine over the counter / Omeprazole (Prilosec) 20mg  daily or Nexium 20mg  daily - Start and Take one capsule 30 min before first meal of day, same time every day for at least 2 weeks, max treatment up to 4 weeks then stop. If it resolves but then comes back again, you can repeat the 2-4 week course again as needed.  - Avoid spicy, greasy, fried foods, also things like caffeine, dark chocolate, peppermint can worsen - Avoid large meals and late night snacks, also do not go more than 4-5 hours without a snack or meal (not eating will worsen reflux symptoms due to stomach acid)  If the problem improves but keeps coming back, we can discuss higher dose or longer course at next visit.  If symptoms are worsening, persistent symptoms despite treatment or develop esophageal or abdominal pain, unable to swallow solids or liquids, nausea, vomiting, fever/chills, or unintentional weight loss / no appetite, please follow-up sooner or seek more immediate medical attention.  -----------------  We will fax your form once the results are in. Stay tuned on mychart 24-48 hours.  DUE for FASTING BLOOD WORK (no food or drink after midnight before the lab appointment, only water or coffee without cream/sugar on the morning of)  SCHEDULE "Lab Only" visit in the morning at the clinic for lab draw in 1 YEAR  - Make sure Lab Only appointment is at about 1 week before your next appointment, so that  results will be available  For Lab Results, once available within 2-3 days of blood draw, you can can log in to MyChart online to view your results and a brief explanation. Also, we can discuss results at next follow-up visit.    Please schedule a Follow-up Appointment to: Return in about 1 year (around 08/23/2022) for 1 year Annual Physical (prefers fasting lab only BEFORE PHYSICAL same day).  If you have any other questions or concerns, please feel free to call the office or send a message through MyChart. You may also schedule an earlier appointment if necessary.  Additionally, you may be receiving a survey about your experience at our office within a few days to 1 week by e-mail or mail. We value your feedback.  , DO [facility], Saralyn Pilar

## 2021-08-24 LAB — COMPLETE METABOLIC PANEL WITH GFR
AG Ratio: 1.6 (calc) (ref 1.0–2.5)
ALT: 30 U/L (ref 9–46)
AST: 18 U/L (ref 10–40)
Albumin: 4.3 g/dL (ref 3.6–5.1)
Alkaline phosphatase (APISO): 79 U/L (ref 36–130)
BUN: 15 mg/dL (ref 7–25)
CO2: 23 mmol/L (ref 20–32)
Calcium: 8.9 mg/dL (ref 8.6–10.3)
Chloride: 105 mmol/L (ref 98–110)
Creat: 0.73 mg/dL (ref 0.60–1.26)
Globulin: 2.7 g/dL (calc) (ref 1.9–3.7)
Glucose, Bld: 95 mg/dL (ref 65–99)
Potassium: 4 mmol/L (ref 3.5–5.3)
Sodium: 138 mmol/L (ref 135–146)
Total Bilirubin: 0.7 mg/dL (ref 0.2–1.2)
Total Protein: 7 g/dL (ref 6.1–8.1)
eGFR: 125 mL/min/{1.73_m2} (ref >=60)

## 2021-08-24 LAB — LIPID PANEL
Cholesterol: 181 mg/dL (ref <200)
HDL: 44 mg/dL (ref >=40)
LDL Cholesterol (Calc): 110 mg/dL (calc) — ABNORMAL HIGH
Non-HDL Cholesterol (Calc): 137 mg/dL (calc) — ABNORMAL HIGH (ref <130)
Total CHOL/HDL Ratio: 4.1 (calc) (ref <5.0)
Triglycerides: 156 mg/dL — ABNORMAL HIGH (ref <150)

## 2021-08-24 LAB — CBC WITH DIFFERENTIAL/PLATELET
Absolute Monocytes: 540 cells/uL (ref 200–950)
Basophils Absolute: 59 cells/uL (ref 0–200)
Basophils Relative: 0.8 %
Eosinophils Absolute: 118 cells/uL (ref 15–500)
Eosinophils Relative: 1.6 %
HCT: 42.6 % (ref 38.5–50.0)
Hemoglobin: 14.4 g/dL (ref 13.2–17.1)
Lymphs Abs: 2235 cells/uL (ref 850–3900)
MCH: 30 pg (ref 27.0–33.0)
MCHC: 33.8 g/dL (ref 32.0–36.0)
MCV: 88.8 fL (ref 80.0–100.0)
MPV: 11.2 fL (ref 7.5–12.5)
Monocytes Relative: 7.3 %
Neutro Abs: 4447 cells/uL (ref 1500–7800)
Neutrophils Relative %: 60.1 %
Platelets: 210 10*3/uL (ref 140–400)
RBC: 4.8 10*6/uL (ref 4.20–5.80)
RDW: 12 % (ref 11.0–15.0)
Total Lymphocyte: 30.2 %
WBC: 7.4 10*3/uL (ref 3.8–10.8)

## 2021-08-24 LAB — HEMOGLOBIN A1C
Hgb A1c MFr Bld: 5.4 % of total Hgb (ref <5.7)
Mean Plasma Glucose: 108 mg/dL
eAG (mmol/L): 6 mmol/L

## 2021-08-24 LAB — HEPATITIS C ANTIBODY
Hepatitis C Ab: NONREACTIVE
SIGNAL TO CUT-OFF: 0.14 (ref <1.00)

## 2021-09-10 ENCOUNTER — Other Ambulatory Visit: Payer: Self-pay

## 2021-09-10 ENCOUNTER — Encounter: Payer: Self-pay | Admitting: Family Medicine

## 2021-09-10 ENCOUNTER — Encounter: Payer: Self-pay | Admitting: Emergency Medicine

## 2021-09-10 ENCOUNTER — Ambulatory Visit
Admission: EM | Admit: 2021-09-10 | Discharge: 2021-09-10 | Disposition: A | Discharge status: Home / Self Care | Payer: BC Managed Care – PPO | Attending: Emergency Medicine | Admitting: Emergency Medicine

## 2021-09-10 DIAGNOSIS — J101 Influenza due to other identified influenza virus with other respiratory manifestations: Secondary | ICD-10-CM

## 2021-09-10 DIAGNOSIS — R059 Cough, unspecified: Secondary | ICD-10-CM | POA: Diagnosis not present

## 2021-09-10 DIAGNOSIS — R21 Rash and other nonspecific skin eruption: Secondary | ICD-10-CM | POA: Diagnosis not present

## 2021-09-10 DIAGNOSIS — R509 Fever, unspecified: Secondary | ICD-10-CM | POA: Diagnosis not present

## 2021-09-10 DIAGNOSIS — Z20822 Contact with and (suspected) exposure to covid-19: Secondary | ICD-10-CM | POA: Insufficient documentation

## 2021-09-10 LAB — RESP PANEL BY RT-PCR (FLU A&B, COVID) ARPGX2
Influenza A by PCR: POSITIVE — AB
Influenza B by PCR: NEGATIVE
SARS Coronavirus 2 by RT PCR: NEGATIVE

## 2021-09-10 MED ORDER — ONDANSETRON HCL 4 MG PO TABS: 4.0000 mg | ORAL_TABLET | Freq: Four times a day (QID) | ORAL | 0 refills | Status: DC

## 2021-09-10 MED ORDER — OSELTAMIVIR PHOSPHATE 75 MG PO CAPS: 75.0000 mg | ORAL_CAPSULE | Freq: Two times a day (BID) | ORAL | 0 refills | Status: DC

## 2021-09-10 MED ORDER — ACETAMINOPHEN 500 MG PO TABS
1000.0000 mg | ORAL_TABLET | Freq: Four times a day (QID) | ORAL | Status: DC | PRN
  Administered 2021-09-10: 1000 mg via ORAL

## 2021-09-10 MED ORDER — BENZONATATE 100 MG PO CAPS
100.0000 mg | ORAL_CAPSULE | Freq: Three times a day (TID) | ORAL | 0 refills | Status: DC
Start: 2021-09-10 — End: 2022-02-28

## 2021-09-10 NOTE — ED Triage Notes (Signed)
Patient states that he had lip swelling that started yesterday.  Patient also reports rash that started yesterday on his face not itching.  Patient c/o fever, cough, and HA and bodyache that started last night.

## 2021-09-10 NOTE — ED Provider Notes (Signed)
[facility]    CSN: [id number] Arrival date & time: 09/10/21  1539      History   Chief Complaint Chief Complaint  Patient presents with   Fever   Rash   Cough    HPI Jeffery Krueger is a 31 y.o. male.   HPI  Cold Symptoms: Patient reports that they have had symptoms of fever, cough, rash of face, body aches for the past 1 days. Symptoms are stable. They deny SOB, chest pain, or vomiting. They have tried ibuprofen for symptoms. No known sick contacts.    History reviewed. No pertinent past medical history.  Patient Active Problem List   Diagnosis Date Noted   Obesity (BMI 30.0-34.9) 08/23/2021   Environmental and seasonal allergies 02/16/2018    Past Surgical History:  Procedure Laterality Date   EYE SURGERY         Home Medications    Prior to Admission medications   Not on File    Family History Family History  Problem Relation Age of Onset   Prostate cancer Neg Hx    Colon cancer Neg Hx    Diabetes Neg Hx     Social History Social History   Tobacco Use   Smoking status: Former    Packs/day: 0.05    Types: Cigarettes    Quit date: 02/06/2019    Years since quitting: 2.5   Smokeless tobacco: Former  Building services engineer Use: Never used  Substance Use Topics   Alcohol use: Yes    Alcohol/week: 5.0 standard drinks    Types: 5 Cans of beer per week   Drug use: Never     Allergies   Patient has no known allergies.   Review of Systems Review of Systems  As stated above in HPI Physical Exam Triage Vital Signs ED Triage Vitals  Enc Vitals Group     BP 09/10/21 1557 (!) 138/93     Pulse Rate 09/10/21 1557 (!) 133     Resp 09/10/21 1557 16     Temp 09/10/21 1557 (!) 101.8 F (38.8 C)     Temp Source 09/10/21 1557 Oral     SpO2 09/10/21 1557 99 %     Weight 09/10/21 1555 237 lb (107.5 kg)     Height 09/10/21 1555 5\' 11"  (1.803 m)     Head Circumference --      Peak Flow --      Pain Score 09/10/21 1555 10     Pain Loc  --      Pain Edu? --      Excl. in GC? --    No data found.  Updated Vital Signs BP (!) 138/93 (BP Location: Left Arm)    Pulse (!) 133    Temp (!) 101.8 F (38.8 C) (Oral) Comment: last took Tylenol 2 hours ago   Resp 16    Ht 5\' 11"  (1.803 m)    Wt 237 lb (107.5 kg)    SpO2 99%    BMI 33.05 kg/m   Physical Exam Vitals and nursing note reviewed.  Constitutional:      General: He is not in acute distress.    Appearance: Normal appearance. He is well-developed. He is ill-appearing and diaphoretic. He is not toxic-appearing.  HENT:     Head: Normocephalic and atraumatic.     Right Ear: Tympanic membrane normal.     Left Ear: Tympanic membrane normal.     Nose: Congestion and rhinorrhea present.  Mouth/Throat:     Mouth: Mucous membranes are moist.     Pharynx: Oropharynx is clear. No oropharyngeal exudate or posterior oropharyngeal erythema.  Eyes:     Conjunctiva/sclera: Conjunctivae normal.  Cardiovascular:     Rate and Rhythm: Normal rate and regular rhythm.     Heart sounds: No murmur heard. Pulmonary:     Effort: Pulmonary effort is normal. No respiratory distress.     Breath sounds: Normal breath sounds.  Abdominal:     Palpations: Abdomen is soft.     Tenderness: There is no abdominal tenderness.  Musculoskeletal:        General: No swelling.     Cervical back: Neck supple.  Lymphadenopathy:     Cervical: No cervical adenopathy.  Skin:    General: Skin is warm.     Capillary Refill: Capillary refill takes less than 2 seconds.     Comments: Maculopapular rash of face  Neurological:     Mental Status: He is alert and oriented to person, place, and time.  Psychiatric:        Mood and Affect: Mood normal.     UC Treatments / Results  Labs (all labs ordered are listed, but only abnormal results are displayed) Labs Reviewed  RESP PANEL BY RT-PCR (FLU A&B, COVID) [id number]    EKG   Radiology No results found.  Procedures Procedures (including critical  care time)  Medications Ordered in UC Medications - No data to display  Initial Impression / Assessment and Plan / UC Course  I have reviewed the triage vital signs and the nursing notes.  Pertinent labs & imaging results that were available during my care of the patient were reviewed by me and considered in my medical decision making (see chart for details).     New. Treating with rest, fluids, tamiflu, tessalon. Discussed red flag signs and symptoms. Follow up as needed.  Final Clinical Impressions(s) / UC Diagnoses   Final diagnoses:  None   Discharge Instructions   None    ED Prescriptions   None    PDMP not reviewed this encounter.   Rushie Chestnut, [place] 09/10/21 1655

## 2022-02-28 ENCOUNTER — Ambulatory Visit (INDEPENDENT_AMBULATORY_CARE_PROVIDER_SITE_OTHER): Payer: BC Managed Care – PPO | Admitting: Family Medicine

## 2022-02-28 ENCOUNTER — Encounter: Payer: Self-pay | Admitting: Family Medicine

## 2022-02-28 VITALS — BP 136/87 | HR 83 | Ht 71.0 in | Wt 234.4 lb

## 2022-02-28 DIAGNOSIS — M62838 Other muscle spasm: Secondary | ICD-10-CM | POA: Diagnosis not present

## 2022-02-28 DIAGNOSIS — R202 Paresthesia of skin: Secondary | ICD-10-CM

## 2022-02-28 DIAGNOSIS — G589 Mononeuropathy, unspecified: Secondary | ICD-10-CM

## 2022-02-28 MED ORDER — NAPROXEN 500 MG PO TABS: 500.0000 mg | ORAL_TABLET | Freq: Two times a day (BID) | ORAL | 2 refills | Status: DC

## 2022-02-28 MED ORDER — BACLOFEN 10 MG PO TABS
5.0000 mg | ORAL_TABLET | Freq: Two times a day (BID) | ORAL | 2 refills | Status: DC | PRN
Start: 2022-02-28 — End: 2022-08-22

## 2022-02-28 NOTE — Patient Instructions (Addendum)
Thank you for coming to the office today.  BP is mild elevated but not a concern.  It sounds exactly as if there is a pinched nerve in the neck that is irritated.  Try the following medicines to treat it.  If it does not resolve or improve over 2-4 weeks, we can consider X-ray of Neck to evaluate the spine and help figure out where it is pinched  May also refer to [facility] specialist to help identify the nerve and treat.  Start taking Baclofen (Lioresal) 10mg  (muscle relaxant) - start with half (cut) to one whole pill at night as needed for next 1-3 nights (may make you drowsy, caution with driving) see how it affects you, then if tolerated increase to one pill 2 times a day or   Recommend trial of Anti-inflammatory with Naproxen (Naprosyn) 500mg  tabs - take one with food and plenty of water TWICE daily every day (breakfast and dinner), for next 1 to 2 weeks, then you may take only as needed - DO NOT TAKE any ibuprofen, aleve, motrin while you are taking this medicine   It is safe to take Tylenol Ext Str 500mg  tabs - take 1 to 2 (max dose 1000mg ) every 6 hours as needed for breakthrough pain, max 24 hour daily dose is 6 to 8 tablets or 4000mg    Please schedule a Follow-up Appointment to: Return in about 4 weeks (around 03/28/2022), or if symptoms worsen or fail to improve, for 4 weeks as needed if not improved RUE Paresthesia pinched nerve.  If you have any other questions or concerns, please feel free to call the office or send a message through MyChart. You may also schedule an earlier appointment if necessary.  Additionally, you may be receiving a survey about your experience at our office within a few days to 1 week by e-mail or mail. We value your feedback.  , DO [facility], [facility]  Cervical Strain and Sprain Rehab Ask your health care provider which exercises are safe for you. Do exercises exactly as told by your health care provider and adjust  them as directed. It is normal to feel mild stretching, pulling, tightness, or discomfort as you do these exercises. Stop right away if you feel sudden pain or your pain gets worse. Do not begin these exercises until told by your health care provider. Stretching and range-of-motion exercises Cervical side bending  Using good posture, sit on a stable chair or stand up. Without moving your shoulders, slowly tilt your left / right ear to your shoulder until you feel a stretch in the opposite side neck muscles. You should be looking straight ahead. Hold for __________ seconds. Repeat with the other side of your neck. Repeat __________ times. Complete this exercise __________ times a day. Cervical rotation  Using good posture, sit on a stable chair or stand up. Slowly turn your head to the side as if you are looking over your left / right shoulder. Keep your eyes level with the ground. Stop when you feel a stretch along the side and the back of your neck. Hold for __________ seconds. Repeat this by turning to your other side. Repeat __________ times. Complete this exercise __________ times a day. Thoracic extension and pectoral stretch  Roll a towel or a small blanket so it is about 4 inches (10 cm) in diameter. Lie down on your back on a firm surface. Put the towel in the middle of your back across your spine. It should  not be under your shoulder blades. Put your hands behind your head and let your elbows fall out to your sides. Hold for __________ seconds. Repeat __________ times. Complete this exercise __________ times a day. Strengthening exercises Isometric upper cervical flexion  Lie on your back with a thin pillow behind your head and a small rolled-up towel under your neck. Gently tuck your chin toward your chest and nod your head down to look toward your feet. Do not lift your head off the pillow. Hold for __________ seconds. Release the tension slowly. Relax your neck muscles  completely before you repeat this exercise. Repeat __________ times. Complete this exercise __________ times a day. Isometric cervical extension  Stand about 6 inches (15 cm) away from a wall, with your back facing the wall. Place a soft object, about 6-8 inches (15-20 cm) in diameter, between the back of your head and the wall. A soft object could be a small pillow, a ball, or a folded towel. Gently tilt your head back and press into the soft object. Keep your jaw and forehead relaxed. Hold for __________ seconds. Release the tension slowly. Relax your neck muscles completely before you repeat this exercise. Repeat __________ times. Complete this exercise __________ times a day. Posture and body mechanics Body mechanics refers to the movements and positions of your body while you do your daily activities. Posture is part of body mechanics. Good posture and healthy body mechanics can help to relieve stress in your body's tissues and joints. Good posture means that your spine is in its natural S-curve position (your spine is neutral), your shoulders are pulled back slightly, and your head is not tipped forward. The following are general guidelines for applying improved posture and body mechanics to your everyday activities. Sitting  When sitting, keep your spine neutral and keep your feet flat on the floor. Use a footrest, if necessary, and keep your thighs parallel to the floor. Avoid rounding your shoulders, and avoid tilting your head forward. When working at a desk or a computer, keep your desk at a height where your hands are slightly lower than your elbows. Slide your chair under your desk so you are close enough to maintain good posture. When working at a computer, place your monitor at a height where you are looking straight ahead and you do not have to tilt your head forward or downward to look at the screen. Standing  When standing, keep your spine neutral and keep your feet about  hip-width apart. Keep a slight bend in your knees. Your ears, shoulders, and hips should line up. When you do a task in which you stand in one place for a long time, place one foot up on a stable object that is 2-4 inches (5-10 cm) high, such as a footstool. This helps keep your spine neutral. Resting When lying down and resting, avoid positions that are most painful for you. Try to support your neck in a neutral position. You can use a contour pillow or a small rolled-up towel. Your pillow should support your neck but not push on it. This information is not intended to replace advice given to you by your health care provider. Make sure you discuss any questions you have with your health care provider. Document Revised: 06/14/2021 Document Reviewed: 06/14/2021 Elsevier Patient Education  2023 [employer].

## 2022-02-28 NOTE — Progress Notes (Unsigned)
Subjective:    Patient ID: Jeffery Krueger, male    DOB: 12/13/1990, 31 y.o.   MRN: [id number]  Jeffery Krueger is a 31 y.o. male presenting on 02/28/2022 for Elevated BP, Right Arm Numbness   HPI  Elevated BP without diagnosis Reports recently onset 1 week ago with Right arm numbness weakness, lasting minutes episodic, no clear cause or provoking factor. He can be active or resting and it can occur.    Current Meds - ***   Reports good compliance, ***took meds today. Tolerating well, w/o complaints. Lifestyle: - Diet: *** - Exercise: *** Denies CP, dyspnea, HA, edema, dizziness / lightheadedness   Obesity BMI >33 / Elevated LDL / Impaired Fasting Glucose / Lifestyle / Background History Overall feels good and doing well with lifestyle improvement Lab results - Previously had improved glucose and cholesterol with wt loss Lifestyle: Weight up about 14 lbs in 1 year - Diet: Improving diet - Exercise:  Goal to resume cardio now, to work on weight, says has been inactive for about 4 months with recent newborn.   Environmental Allergies Seasonal He is interested to see Allergist in future for allergy shots.   Additional complaint 2 weeks ago has had some neck swelling symptoms, with some pain with rotation of neck, admits some sore throat difficulty with swallowing. Tried NyQuil without relief. Denies sinus drainage, cough   TLC  Lasik - corrected vision, bilateral, no contacts.   Former Smoker - 02/2019   History of COVID19 08/2020 had COVID. He had higher fever up to 101. He has respiratory symptoms and cough and aching and back pain. He has had some lingering back pain since then.    Health Maintenance: Declined Flu Shot today   Moderna Vaccine completed 02/2020, updated, no booster, will decline for now.      02/28/2022    4:01 PM 08/23/2021    2:17 PM 09/28/2020   10:38 AM  Depression screen PHQ 2/9  Decreased Interest 1 1 0  Down, Depressed, Hopeless 1  1 0  PHQ - 2 Score 2 2 0  Altered sleeping 2 0   Tired, decreased energy 1 0   Change in appetite 1 0   Feeling bad or failure about yourself  1 1   Trouble concentrating 0 0   Moving slowly or fidgety/restless 0 0   Suicidal thoughts 0 0   PHQ-9 Score 7 3   Difficult doing work/chores Somewhat difficult Somewhat difficult     Social History   Tobacco Use   Smoking status: Former    Packs/day: 0.05    Types: Cigarettes    Quit date: 02/06/2019    Years since quitting: 3.0   Smokeless tobacco: Former  Building services engineer Use: Never used  Substance Use Topics   Alcohol use: Yes    Alcohol/week: 5.0 standard drinks of alcohol    Types: 5 Cans of beer per week   Drug use: Never    Review of Systems Per HPI unless specifically indicated above     Objective:    BP 136/87   Pulse 83   Ht 5\' 11"  (1.803 m)   Wt 234 lb 6.4 oz (106.3 kg)   SpO2 99%   BMI 32.69 kg/m   Wt Readings from Last 3 Encounters:  02/28/22 234 lb 6.4 oz (106.3 kg)  09/10/21 237 lb (107.5 kg)  08/23/21 240 lb 3.2 oz (109 kg)    Physical Exam   Results for  orders placed or performed during the hospital encounter of 09/10/21  Resp Panel by RT-PCR (Flu A&B, Covid) Nasopharyngeal Swab   Specimen: Nasopharyngeal Swab; Nasopharyngeal(NP) swabs in vial transport medium  Result Value Ref Range   SARS Coronavirus 2 by RT PCR NEGATIVE NEGATIVE   Influenza A by PCR POSITIVE (A) NEGATIVE   Influenza B by PCR NEGATIVE NEGATIVE      Assessment & Plan:   Problem List Items Addressed This Visit   None Visit Diagnoses     Paresthesia of right upper extremity    -  Primary   Relevant Medications   baclofen (LIORESAL) 10 MG tablet   Pinched nerve in neck       Relevant Medications   baclofen (LIORESAL) 10 MG tablet   naproxen (NAPROSYN) 500 MG tablet   Muscle spasms of neck       Relevant Medications   baclofen (LIORESAL) 10 MG tablet   naproxen (NAPROSYN) 500 MG tablet       Meds ordered this  encounter  Medications   baclofen (LIORESAL) 10 MG tablet    Sig: Take 0.5-1 tablets (5-10 mg total) by mouth 2 (two) times daily as needed for muscle spasms.    Dispense:  60 each    Refill:  2   naproxen (NAPROSYN) 500 MG tablet    Sig: Take 1 tablet (500 mg total) by mouth 2 (two) times daily with a meal. For 1-2 weeks then as needed    Dispense:  60 tablet    Refill:  2     Follow up plan: Return in about 4 weeks (around 03/28/2022), or if symptoms worsen or fail to improve, for 4 weeks as needed if not improved RUE Paresthesia pinched nerve.  Future labs ordered for ***   Saralyn Pilar, DO [facility] 02/28/2022, 4:14 PM

## 2022-08-22 ENCOUNTER — Encounter: Payer: Self-pay | Admitting: Family Medicine

## 2022-08-22 ENCOUNTER — Ambulatory Visit: Payer: BC Managed Care – PPO | Admitting: Family Medicine

## 2022-08-22 VITALS — BP 120/78 | HR 77 | Ht 71.0 in | Wt 235.8 lb

## 2022-08-22 DIAGNOSIS — Z0000 Encounter for general adult medical examination without abnormal findings: Secondary | ICD-10-CM

## 2022-08-22 DIAGNOSIS — R7309 Other abnormal glucose: Secondary | ICD-10-CM

## 2022-08-22 DIAGNOSIS — E7800 Pure hypercholesterolemia, unspecified: Secondary | ICD-10-CM | POA: Diagnosis not present

## 2022-08-22 DIAGNOSIS — E669 Obesity, unspecified: Secondary | ICD-10-CM | POA: Diagnosis not present

## 2022-08-22 NOTE — Progress Notes (Signed)
Subjective:    Patient ID: Jeffery Krueger, male    DOB: [birth year], 32 y.o.   MRN: [id number]  Jeffery Krueger is a 32 y.o. male presenting on 08/22/2022 for Annual Exam   HPI  Here for Annual Physical and Lab Review   Obesity BMI >32 / Elevated LDL / Impaired Fasting Glucose / Lifestyle / Background History Overall feels good and doing well with lifestyle improvement Lab results - Previously had improved glucose and cholesterol with wt loss Lifestyle: Weight stable, plus minus 1 lbs - Diet: Improving diet - Exercise:  Goal to resume cardio now, to work on weight.   Environmental Allergies Seasonal   TLC [place] Lasik - corrected vision, bilateral, no contacts.   Former Smoker - 02/2019    Health Maintenance: Declined Flu Shot today     08/22/2022   10:29 AM 02/28/2022    4:01 PM 08/23/2021    2:17 PM  Depression screen PHQ 2/9  Decreased Interest 1 1 1   Down, Depressed, Hopeless 1 1 1   PHQ - 2 Score 2 2 2   Altered sleeping 1 2 0  Tired, decreased energy 2 1 0  Change in appetite 2 1 0  Feeling bad or failure about yourself  2 1 1   Trouble concentrating 2 0 0  Moving slowly or fidgety/restless 1 0 0  Suicidal thoughts 0 0 0  PHQ-9 Score 12 7 3   Difficult doing work/chores Very difficult Somewhat difficult Somewhat difficult        08/22/2022   10:29 AM 02/28/2022    4:01 PM 08/23/2021    2:17 PM  GAD 7 : Generalized Anxiety Score  Nervous, Anxious, on Edge 0 1 1  Control/stop worrying 1 1 1   Worry too much - different things 1 1 1   Trouble relaxing 1 1 0  Restless 1 1 1   Easily annoyed or irritable 2 1 1   Afraid - awful might happen 0 0 0  Total GAD 7 Score 6 6 5   Anxiety Difficulty Not difficult at all Not difficult at all Not difficult at all     History reviewed. No pertinent past medical history. Past Surgical History:  Procedure Laterality Date   EYE SURGERY     Social History   Socioeconomic History   Marital status: Single    Spouse  name: Not on file   Number of children: 1   Years of education: College   Highest education level: Bachelor's degree (e.g., BA, AB, BS)  Occupational History   Occupation: [employer] of  Tobacco Use   Smoking status: Former    Packs/day: 0.05    Types: Cigarettes    Quit date: 02/06/2019    Years since quitting: 3.5   Smokeless tobacco: Former  08/24/2022 Use: Never used  Substance and Sexual Activity   Alcohol use: Yes    Alcohol/week: 5.0 standard drinks of alcohol    Types: 5 Cans of beer per week   Drug use: Never   Sexual activity: Not on file  Other Topics Concern   Not on file  Social History Narrative   Not on file   Social Determinants of Health   Financial Resource Strain: Not on file  Food Insecurity: Not on file  Transportation Needs: Not on file  Physical Activity: Not on file  Stress: Not on file  Social Connections: Not on file  Intimate Partner Violence: Not on file   Family History  Problem Relation Age  of Onset   Prostate cancer Neg Hx    Colon cancer Neg Hx    Diabetes Neg Hx    No current outpatient medications on file prior to visit.   No current facility-administered medications on file prior to visit.    Review of Systems  Constitutional:  Negative for activity change, appetite change, chills, diaphoresis, fatigue and fever.  HENT:  Negative for congestion and hearing loss.   Eyes:  Negative for visual disturbance.  Respiratory:  Negative for cough, chest tightness, shortness of breath and wheezing.   Cardiovascular:  Negative for chest pain, palpitations and leg swelling.  Gastrointestinal:  Negative for abdominal pain, constipation, diarrhea, nausea and vomiting.  Genitourinary:  Negative for dysuria, frequency and hematuria.  Musculoskeletal:  Negative for arthralgias and neck pain.  Skin:  Negative for rash.  Neurological:  Negative for dizziness, weakness, light-headedness, numbness and headaches.  Hematological:  Negative  for adenopathy.  Psychiatric/Behavioral:  Negative for behavioral problems, dysphoric mood and sleep disturbance.    Per HPI unless specifically indicated above      Objective:    BP 120/78   Pulse 77   Ht 5\' 11"  (1.803 m)   Wt 235 lb 12.8 oz (107 kg)   SpO2 99%   BMI 32.89 kg/m   Wt Readings from Last 3 Encounters:  08/22/22 235 lb 12.8 oz (107 kg)  02/28/22 234 lb 6.4 oz (106.3 kg)  09/10/21 237 lb (107.5 kg)    Physical Exam Vitals and nursing note reviewed.  Constitutional:      General: He is not in acute distress.    Appearance: He is well-developed. He is not diaphoretic.     Comments: Well-appearing, comfortable, cooperative  HENT:     Head: Normocephalic and atraumatic.  Eyes:     General:        Right eye: No discharge.        Left eye: No discharge.     Conjunctiva/sclera: Conjunctivae normal.     Pupils: Pupils are equal, round, and reactive to light.  Neck:     Thyroid: No thyromegaly.     Vascular: No carotid bruit.  Cardiovascular:     Rate and Rhythm: Normal rate and regular rhythm.     Pulses: Normal pulses.     Heart sounds: Normal heart sounds. No murmur heard. Pulmonary:     Effort: Pulmonary effort is normal. No respiratory distress.     Breath sounds: Normal breath sounds. No wheezing or rales.  Abdominal:     General: Bowel sounds are normal. There is no distension.     Palpations: Abdomen is soft. There is no mass.     Tenderness: There is no abdominal tenderness.  Musculoskeletal:        General: No tenderness. Normal range of motion.     Cervical back: Normal range of motion and neck supple.     Right lower leg: No edema.     Left lower leg: No edema.     Comments: Upper / Lower Extremities: - Normal muscle tone, strength bilateral upper extremities 5/5, lower extremities 5/5  Lymphadenopathy:     Cervical: No cervical adenopathy.  Skin:    General: Skin is warm and dry.     Findings: No erythema or rash.  Neurological:      Mental Status: He is alert and oriented to person, place, and time.     Comments: Distal sensation intact to light touch all extremities  Psychiatric:  Mood and Affect: Mood normal.        Behavior: Behavior normal.        Thought Content: Thought content normal.     Comments: Well groomed, good eye contact, normal speech and thoughts       Results for orders placed or performed during the hospital encounter of 09/10/21  Resp Panel by RT-PCR (Flu A&B, Covid) Nasopharyngeal Swab   Specimen: Nasopharyngeal Swab; Nasopharyngeal(NP) swabs in vial transport medium  Result Value Ref Range   SARS Coronavirus 2 by RT PCR NEGATIVE NEGATIVE   Influenza A by PCR POSITIVE (A) NEGATIVE   Influenza B by PCR NEGATIVE NEGATIVE      Assessment & Plan:   Problem List Items Addressed This Visit     Obesity (BMI 30.0-34.9)   Relevant Orders   COMPLETE METABOLIC PANEL WITH GFR   CBC with Differential/Platelet   Lipid panel   Other Visit Diagnoses     Annual physical exam    -  Primary   Relevant Orders   COMPLETE METABOLIC PANEL WITH GFR   CBC with Differential/Platelet   Lipid panel   Hemoglobin A1c   TSH   Abnormal glucose       Relevant Orders   Hemoglobin A1c   Elevated LDL cholesterol level       Relevant Orders   CBC with Differential/Platelet   Lipid panel   TSH       Updated Health Maintenance information Fasting labs ordered today, pending Previously mild elevated LDL, but improved from past Encouraged improvement to lifestyle with diet and exercise Goal of weight loss  Will complete his Yearly Biometric Form when lab results are available.  Orders Placed This Encounter  Procedures   COMPLETE METABOLIC PANEL WITH GFR   CBC with Differential/Platelet   Lipid panel    Order Specific Question:   Has the patient fasted?    Answer:   Yes   Hemoglobin A1c   TSH     No orders of the defined types were placed in this encounter.     Follow up plan: Return  in about 1 year (around 08/23/2023) for 1 year fasting lab only then 1 week later Annual Physical (on [place] day he prefers).  Nobie Putnam, [facility] 08/22/2022, 10:33 AM

## 2022-08-22 NOTE — Patient Instructions (Addendum)
Thank you for coming to the office today.  Blood today, stay tuned for results on MyChart, will fax your biometric paperwork.  Keep up the good work on improving lifestyle, staying active.   DUE for FASTING BLOOD WORK (no food or drink after midnight before the lab appointment, only water or coffee without cream/sugar on the morning of)  SCHEDULE "Lab Only" visit in the morning at the clinic for lab draw in 1 YEAR  - Make sure Lab Only appointment is at about 1 week before your next appointment, so that results will be available  For Lab Results, once available within 2-3 days of blood draw, you can can log in to MyChart online to view your results and a brief explanation. Also, we can discuss results at next follow-up visit.    Please schedule a Follow-up Appointment to: Return in about 1 year (around 08/23/2023) for 1 year fasting lab only then 1 week later Annual Physical (on [place] day he prefers).  If you have any other questions or concerns, please feel free to call the office or send a message through [place]. You may also schedule an earlier appointment if necessary.  Additionally, you may be receiving a survey about your experience at our office within a few days to 1 week by e-mail or mail. We value your feedback.  Nobie Putnam, DO [place]

## 2022-08-23 LAB — HEMOGLOBIN A1C
Hgb A1c MFr Bld: 5.4 % of total Hgb (ref <5.7)
Mean Plasma Glucose: 108 mg/dL
eAG (mmol/L): 6 mmol/L

## 2022-08-23 LAB — CBC WITH DIFFERENTIAL/PLATELET
Absolute Monocytes: 437 cells/uL (ref 200–950)
Basophils Absolute: 30 cells/uL (ref 0–200)
Basophils Relative: 0.4 %
Eosinophils Absolute: 67 cells/uL (ref 15–500)
Eosinophils Relative: 0.9 %
HCT: 46.2 % (ref 38.5–50.0)
Hemoglobin: 15.7 g/dL (ref 13.2–17.1)
Lymphs Abs: 2375 cells/uL (ref 850–3900)
MCH: 30.4 pg (ref 27.0–33.0)
MCHC: 34 g/dL (ref 32.0–36.0)
MCV: 89.5 fL (ref 80.0–100.0)
MPV: 10.9 fL (ref 7.5–12.5)
Monocytes Relative: 5.9 %
Neutro Abs: 4492 cells/uL (ref 1500–7800)
Neutrophils Relative %: 60.7 %
Platelets: 223 10*3/uL (ref 140–400)
RBC: 5.16 10*6/uL (ref 4.20–5.80)
RDW: 12 % (ref 11.0–15.0)
Total Lymphocyte: 32.1 %
WBC: 7.4 10*3/uL (ref 3.8–10.8)

## 2022-08-23 LAB — LIPID PANEL
Cholesterol: 193 mg/dL (ref <200)
HDL: 53 mg/dL (ref >=40)
LDL Cholesterol (Calc): 117 mg/dL (calc) — ABNORMAL HIGH
Non-HDL Cholesterol (Calc): 140 mg/dL (calc) — ABNORMAL HIGH (ref <130)
Total CHOL/HDL Ratio: 3.6 (calc) (ref <5.0)
Triglycerides: 121 mg/dL (ref <150)

## 2022-08-23 LAB — COMPLETE METABOLIC PANEL WITH GFR
AG Ratio: 1.4 (calc) (ref 1.0–2.5)
ALT: 33 U/L (ref 9–46)
AST: 19 U/L (ref 10–40)
Albumin: 4.5 g/dL (ref 3.6–5.1)
Alkaline phosphatase (APISO): 83 U/L (ref 36–130)
BUN: 16 mg/dL (ref 7–25)
CO2: 27 mmol/L (ref 20–32)
Calcium: 9.6 mg/dL (ref 8.6–10.3)
Chloride: 103 mmol/L (ref 98–110)
Creat: 0.79 mg/dL (ref 0.60–1.26)
Globulin: 3.2 g/dL (calc) (ref 1.9–3.7)
Glucose, Bld: 90 mg/dL (ref 65–99)
Potassium: 4.2 mmol/L (ref 3.5–5.3)
Sodium: 138 mmol/L (ref 135–146)
Total Bilirubin: 0.7 mg/dL (ref 0.2–1.2)
Total Protein: 7.7 g/dL (ref 6.1–8.1)
eGFR: 121 mL/min/{1.73_m2} (ref >=60)

## 2022-08-23 LAB — TSH: TSH: 1.63 mIU/L (ref 0.40–4.50)

## 2022-09-30 ENCOUNTER — Encounter: Payer: Self-pay | Admitting: Emergency Medicine

## 2022-09-30 ENCOUNTER — Ambulatory Visit
Admission: EM | Admit: 2022-09-30 | Discharge: 2022-09-30 | Disposition: A | Discharge status: Home / Self Care | Payer: BC Managed Care – PPO | Attending: Family Medicine | Admitting: Family Medicine

## 2022-09-30 ENCOUNTER — Ambulatory Visit (INDEPENDENT_AMBULATORY_CARE_PROVIDER_SITE_OTHER): Payer: BC Managed Care – PPO

## 2022-09-30 DIAGNOSIS — M79605 Pain in left leg: Secondary | ICD-10-CM

## 2022-09-30 DIAGNOSIS — M79662 Pain in left lower leg: Secondary | ICD-10-CM | POA: Diagnosis not present

## 2022-09-30 NOTE — ED Provider Notes (Signed)
[facility]    CSN: [id number] Arrival date & time: 09/30/22  0904      History   Chief Complaint Chief Complaint  Patient presents with   Headache   Numbness    HPI Jeffery Krueger is a 32 y.o. male.   HPI  32 year old male here for evaluation of neurologic complaints.  The patient reports that he developed a global headache 5 days ago that lasted for a day and a half and then completely resolved.  It was not associated with any changes in vision.  The day after his headache he developed numbness in his right arm which had some associated weakness to the grip.  That lasted 2 days and then completely resolved.  2 days ago he developed sharp pain in his left lower leg below the knee that has turned into a numbness and is still present.  He denies any chest pain or shortness of breath.  He also denies any neck or back pain.  He has never had anything like this happen in the past.  He is concerned because his father, who is 37 with hypertension high cholesterol, had a CVA in January 2023.  Patient does have a PCP and he does get an annual physical.  No past medical problems other than an elevated BMI of 32.  History reviewed. No pertinent past medical history.  Patient Active Problem List   Diagnosis Date Noted   Obesity (BMI 30.0-34.9) 08/23/2021   Environmental and seasonal allergies 02/16/2018    Past Surgical History:  Procedure Laterality Date   EYE SURGERY         Home Medications    Prior to Admission medications   Not on File    Family History Family History  Problem Relation Age of Onset   Prostate cancer Neg Hx    Colon cancer Neg Hx    Diabetes Neg Hx     Social History Social History   Tobacco Use   Smoking status: Former    Packs/day: 0.05    Types: Cigarettes    Quit date: 02/06/2019    Years since quitting: 3.6   Smokeless tobacco: Former  Scientific laboratory technician Use: Never used  Substance Use Topics   Alcohol use: Yes     Alcohol/week: 5.0 standard drinks of alcohol    Types: 5 Cans of beer per week   Drug use: Never     Allergies   Patient has no known allergies.   Review of Systems Review of Systems  Respiratory:  Negative for shortness of breath.   Cardiovascular:  Negative for chest pain and leg swelling.  Musculoskeletal:  Positive for myalgias. Negative for back pain and neck pain.  Neurological:  Positive for weakness, numbness and headaches.     Physical Exam Triage Vital Signs ED Triage Vitals  Enc Vitals Group     BP      Pulse      Resp      Temp      Temp src      SpO2      Weight      Height      Head Circumference      Peak Flow      Pain Score      Pain Loc      Pain Edu?      Excl. in [place]?    No data found.  Updated Vital Signs BP 127/88 (BP Location: Left Arm)  Pulse 74   Temp 98.4 F (36.9 C) (Oral)   Resp 15   Ht '5\' 11"'$  (1.803 m)   Wt 230 lb (104.3 kg)   SpO2 98%   BMI 32.08 kg/m   Visual Acuity Right Eye Distance:   Left Eye Distance:   Bilateral Distance:    Right Eye Near:   Left Eye Near:    Bilateral Near:     Physical Exam Vitals and nursing note reviewed.  Constitutional:      Appearance: Normal appearance. He is not ill-appearing.  HENT:     Head: Normocephalic and atraumatic.  Cardiovascular:     Rate and Rhythm: Normal rate and regular rhythm.     Pulses: Normal pulses.     Heart sounds: Normal heart sounds. No murmur heard.    No friction rub. No gallop.  Pulmonary:     Effort: Pulmonary effort is normal.     Breath sounds: Normal breath sounds. No wheezing, rhonchi or rales.  Musculoskeletal:        General: Tenderness present. No swelling, deformity or signs of injury. Normal range of motion.     Comments: Tenderness in left calf with a positive Homans' sign.  No erythema or palpable cording.  Both calves measure 40.5 cm.  Skin:    General: Skin is warm and dry.     Capillary Refill: Capillary refill takes less than 2  seconds.     Findings: No bruising or erythema.  Neurological:     General: No focal deficit present.     Mental Status: He is alert and oriented to person, place, and time.     Cranial Nerves: No cranial nerve deficit.     Sensory: No sensory deficit.     Motor: No weakness.     Coordination: Coordination normal.     Gait: Gait normal.     Deep Tendon Reflexes: Reflexes normal.      UC Treatments / Results  Labs (all labs ordered are listed, but only abnormal results are displayed) Labs Reviewed - No data to display  EKG Normal sinus rhythm with ventricular rate of 65 bpm PR interval 176 ms QRS duration 86 ms QT/QTc 372/286 ms No ST or T wave abnormalities.  Radiology US Venous Img Lower Unilateral Left (DVT)  Result Date: 09/30/2022 CLINICAL DATA:  Left calf pain for 2 days. EXAM: LEFT LOWER EXTREMITY VENOUS DOPPLER ULTRASOUND TECHNIQUE: Gray-scale sonography with compression, as well as color and duplex ultrasound, were performed to evaluate the deep venous system(s) from the level of the common femoral vein through the popliteal and proximal calf veins. COMPARISON:  None Available. FINDINGS: VENOUS Normal compressibility of the common femoral, superficial femoral, and popliteal veins, as well as the visualized calf veins. Visualized portions of profunda femoral vein and great saphenous vein unremarkable. No filling defects to suggest DVT on grayscale or color Doppler imaging. Doppler waveforms show normal direction of venous flow, normal respiratory plasticity and response to augmentation. Limited views of the contralateral common femoral vein are unremarkable. OTHER None. Limitations: None. IMPRESSION: Negative exam. Electronically Signed   By: Inge Rise M.D.   On: 09/30/2022 11:14    Procedures Procedures (including critical care time)  Medications Ordered in UC Medications - No data to display  Initial Impression / Assessment and Plan / UC Course  I have reviewed  the triage vital signs and the nursing notes.  Pertinent labs & imaging results that were available during my care of the patient  were reviewed by me and considered in my medical decision making (see chart for details).   Patient is a nontoxic-appearing 57 old male with no significant past medical history aside from obesity who presents for evaluation of various neurologic symptoms that have present themselves over the last 5 days.  They started out with a global headache that lasted 1.5 days and resolved but numbness in the right arm which lasted for 2 days and resolved.  Now patient is complaining of initially sharp pain in his left lower extremity with subsequent numbness that is still present and has been present for last 2 days.  He denies any chest pain or shortness of breath.  His cardiopulmonary exam is benign.  Cranial nerves II through XII are intact.  Neurologic exam reveals 5/5 bilateral grips and upper extremity strength.  Lower extremity drift is also 5 out of 5.  DTRs are 2+ globally.  He does have some mild left calf tenderness but no cording is palpable and there is no difference in size when compared to the right.  Both calves are 40.5 cm.  He does have a positive Homans' sign on the left.  I will order a venous Doppler of the left lower extremity to rule out DVT.  Patient does not have a history of DVT and I feel that is very low on the differential.  EKG was collected at triage which shows normal sinus rhythm without any T wave or ST abnormalities.  Venous ultrasound of the left lower leg does not show any evidence of DVT and is listed as a negative exam and the radiology report.  The etiology of the patient's symptoms is unclear.  I will discharge him home and treat him for musculoskeletal lower leg pain with NSAIDs, elevation, and ice.  If his symptoms continue he should follow-up with his primary care provider for further workup and evaluation.  Final Clinical Impressions(s) / UC  Diagnoses   Final diagnoses:  Musculoskeletal pain of left lower extremity     Discharge Instructions      Your ultrasound today did not show the evidence of any DVT.  You do have tenderness to your muscles which could be result of muscle inflammation.  Take over-the-counter Aleve or ibuprofen according to the package instructions.  You can apply ice to your lower leg for 20 minutes at a time 2-3 times a day to see if this helps with your discomfort.  You may also apply moist heat.  Keep your left leg elevated is much as possible to help decrease any inflammation that may be present which is causing your symptoms.  If your symptoms continue I recommend following up with your primary care provider for further workup.  If you have return of headache, especially if it associated with vision changes, dizziness, nausea or vomiting I recommend going to the ER for evaluation.     ED Prescriptions   None    PDMP not reviewed this encounter.   Margarette Canada, NP 09/30/22 1120

## 2022-09-30 NOTE — ED Triage Notes (Signed)
Patient states that he had a headache that started on this past Sunday and last for 1 and half days.  Patient states that on Monday night he started having right arm numbness and lasted only 2 days.  Patient reports left lower leg numbness that started on Wed.  Patient states that the numbness and tingling in his legs has progressively worse.  Patient denies any chest pain or SOB.  Patient denies HAS at this time.  Patient reports that his father had a stroke Jan 2023.

## 2022-09-30 NOTE — Discharge Instructions (Addendum)
Your ultrasound today did not show the evidence of any DVT.  You do have tenderness to your muscles which could be result of muscle inflammation.  Take over-the-counter Aleve or ibuprofen according to the package instructions.  You can apply ice to your lower leg for 20 minutes at a time 2-3 times a day to see if this helps with your discomfort.  You may also apply moist heat.  Keep your left leg elevated is much as possible to help decrease any inflammation that may be present which is causing your symptoms.  If your symptoms continue I recommend following up with your primary care provider for further workup.  If you have return of headache, especially if it associated with vision changes, dizziness, nausea or vomiting I recommend going to the ER for evaluation.

## 2022-10-03 ENCOUNTER — Ambulatory Visit: Payer: Self-pay

## 2022-10-03 NOTE — Telephone Encounter (Signed)
  Chief Complaint: pain and tingling to right arm and left leg Symptoms: pain that responds to OTC med , pain in leg makes it hard to walk  Frequency: Sat Pertinent Negatives: Patient denies weakness or numbness to face arms or legs chest pain , SOB Disposition: []$ ED /[]$ Urgent Care (no appt availability in office) / [x]$ Appointment(In office/virtual)/ []$  [facility] [facility]/ []$ Home Care/ []$ Refused Recommended Disposition /[]$ [place] [facility]/ []$  Follow-up with PCP Additional Notes: appt tomorrow Reason for Disposition  [1] Numbness or tingling on both sides of body AND [2] is a new symptom present > 24 hours  Answer Assessment - Initial Assessment Questions 1. SYMPTOM: "What is the main symptom you are concerned about?" (e.g., weakness, numbness)     tingling 2. ONSET: "When did this start?" (minutes, hours, days; while sleeping)     Last week with UC- not "anyting 3. LAST NORMAL: "When was the last time you (the patient) were normal (no symptoms)?"     2 weeks ago 4. PATTERN "Does this come and go, or has it been constant since it started?"  "Is it present now?"     Comes and goes  5. CARDIAC SYMPTOMS: "Have you had any of the following symptoms: chest pain, difficulty breathing, palpitations?"     no 6. NEUROLOGIC SYMPTOMS: "Have you had any of the following symptoms: headache, dizziness, vision loss, double vision, changes in speech, unsteady on your feet?"     Left leg pain and tingling , 7. OTHER SYMPTOMS: "Do you have any other symptoms?"     Right arm tingling- comes and goes pain tingling 6/10  Protocols used: Neurologic Deficit-A-AH

## 2022-10-04 ENCOUNTER — Ambulatory Visit: Payer: BC Managed Care – PPO | Admitting: Family Medicine

## 2022-10-04 ENCOUNTER — Ambulatory Visit
Admission: RE | Admit: 2022-10-04 | Discharge: 2022-10-04 | Disposition: A | Discharge status: Home / Self Care | Payer: BC Managed Care – PPO | Source: Ambulatory Visit | Attending: Family Medicine | Admitting: Family Medicine

## 2022-10-04 ENCOUNTER — Encounter: Payer: Self-pay | Admitting: Family Medicine

## 2022-10-04 VITALS — BP 110/80 | HR 77 | Ht 71.0 in | Wt 232.0 lb

## 2022-10-04 DIAGNOSIS — R202 Paresthesia of skin: Secondary | ICD-10-CM

## 2022-10-04 DIAGNOSIS — D692 Other nonthrombocytopenic purpura: Secondary | ICD-10-CM

## 2022-10-04 DIAGNOSIS — R29818 Other symptoms and signs involving the nervous system: Secondary | ICD-10-CM | POA: Diagnosis not present

## 2022-10-04 DIAGNOSIS — G930 Cerebral cysts: Secondary | ICD-10-CM | POA: Diagnosis not present

## 2022-10-04 DIAGNOSIS — R519 Headache, unspecified: Secondary | ICD-10-CM | POA: Diagnosis not present

## 2022-10-04 MED ORDER — PREDNISONE 20 MG PO TABS: ORAL_TABLET | ORAL | 0 refills | Status: DC

## 2022-10-04 MED ORDER — GABAPENTIN 100 MG PO CAPS: ORAL_CAPSULE | ORAL | 1 refills | Status: DC

## 2022-10-04 NOTE — Progress Notes (Addendum)
Subjective:    Patient ID: Jeffery Krueger, male    DOB: [birth year], 32 y.o.   MRN: [id number]  Jeffery Krueger is a 32 y.o. male presenting on 10/04/2022 for Arm Pain (Pt. States he's having pain and numbness in right arm for about a week.) and Leg Pain (Pt. States he's having pain and numbness in left leg for about a week. )   HPI  Headache RUE Paresthesia LLE Paresthesia  1 week ago acute onset, Headache generalized. He does not have history migraines Right arm pain and tingling Left lower extremity pain and tingling He went to Urgent Care on 09/30/22 had EKG, LLE US Doppler These paresthesia lasted for 1-2 days each, then resolved  No substance history. No significant stressors or other changes. No new medications  He was given Ibuprofen and Aleve for the R arm pain. Does not seem to help it as much  Currently his sensation has returned in RUE and LLE but he has persistent pain in RUE with tingling neuropathic pain symptoms.  Denies any focal weakness persistent numbness tingling nausea vomiting     08/22/2022   10:29 AM 02/28/2022    4:01 PM 08/23/2021    2:17 PM  Depression screen PHQ 2/9  Decreased Interest '1 1 1  '$ Down, Depressed, Hopeless '1 1 1  '$ PHQ - 2 Score '2 2 2  '$ Altered sleeping 1 2 0  Tired, decreased energy 2 1 0  Change in appetite 2 1 0  Feeling bad or failure about yourself  '2 1 1  '$ Trouble concentrating 2 0 0  Moving slowly or fidgety/restless 1 0 0  Suicidal thoughts 0 0 0  PHQ-9 Score '12 7 3  '$ Difficult doing work/chores Very difficult Somewhat difficult Somewhat difficult    Social History   Tobacco Use   Smoking status: Former    Packs/day: 0.05    Types: Cigarettes    Quit date: 02/06/2019    Years since quitting: 3.6   Smokeless tobacco: Former  Scientific laboratory technician Use: Never used  Substance Use Topics   Alcohol use: Yes    Alcohol/week: 5.0 standard drinks of alcohol    Types: 5 Cans of beer per week   Drug use: Never    Review of  Systems Per HPI unless specifically indicated above     Objective:    BP 110/80   Pulse 77   Ht '5\' 11"'$  (1.803 m)   Wt 232 lb (105.2 kg)   SpO2 100%   BMI 32.36 kg/m   Wt Readings from Last 3 Encounters:  10/04/22 232 lb (105.2 kg)  09/30/22 230 lb (104.3 kg)  08/22/22 235 lb 12.8 oz (107 kg)    Physical Exam Vitals and nursing note reviewed.  Constitutional:      General: He is not in acute distress.    Appearance: He is well-developed. He is not diaphoretic.     Comments: Well-appearing, comfortable, cooperative  HENT:     Head: Normocephalic and atraumatic.  Eyes:     General:        Right eye: No discharge.        Left eye: No discharge.     Conjunctiva/sclera: Conjunctivae normal.  Neck:     Thyroid: No thyromegaly.  Cardiovascular:     Rate and Rhythm: Normal rate and regular rhythm.     Pulses: Normal pulses.     Heart sounds: Normal heart sounds. No murmur heard. Pulmonary:  Effort: Pulmonary effort is normal. No respiratory distress.     Breath sounds: Normal breath sounds. No wheezing or rales.  Musculoskeletal:        General: Normal range of motion.     Cervical back: Normal range of motion and neck supple.     Comments: Full range of motion upper and lower extremities.  Lymphadenopathy:     Cervical: No cervical adenopathy.  Skin:    General: Skin is warm and dry.     Findings: No erythema or rash.  Neurological:     General: No focal deficit present.     Mental Status: He is alert and oriented to person, place, and time. Mental status is at baseline.     Comments: Strength intact 5/5 bilateral extremities  Psychiatric:        Behavior: Behavior normal.     Comments: Well groomed, good eye contact, normal speech and thoughts     I have personally reviewed the radiology report from 09/30/22 on LLE [place].   Study Result CLINICAL DATA: Left calf pain for 2 days.  EXAM: LEFT LOWER EXTREMITY VENOUS DOPPLER ULTRASOUND  TECHNIQUE: Gray-scale  sonography with compression, as well as color and duplex ultrasound, were performed to evaluate the deep venous system(s) from the level of the common femoral vein through the popliteal and proximal calf veins.  COMPARISON: None Available.  FINDINGS: VENOUS  Normal compressibility of the common femoral, superficial femoral, and popliteal veins, as well as the visualized calf veins. Visualized portions of profunda femoral vein and great saphenous vein unremarkable. No filling defects to suggest DVT on grayscale or color Doppler imaging. Doppler waveforms show normal direction of venous flow, normal respiratory plasticity and response to augmentation.  Limited views of the contralateral common femoral vein are unremarkable.  OTHER  None.  Limitations: None.  IMPRESSION: Negative exam.   Electronically Signed By: Inge Rise M.D. On: 09/30/2022 11:14  --------------------   I have personally reviewed the radiology report from 10/04/22 STAT head CT wo contrast  CLINICAL DATA:  Headache.  Neuro deficit.   EXAM: CT HEAD WITHOUT CONTRAST   TECHNIQUE: Contiguous axial images were obtained from the base of the skull through the vertex without intravenous contrast.   RADIATION DOSE REDUCTION: This exam was performed according to the departmental dose-optimization program which includes automated exposure control, adjustment of the mA and/or kV according to patient size and/or use of iterative reconstruction technique.   COMPARISON:  None Available.   FINDINGS: Brain: The ventricles are in the midline without mass effect or shift. No extra-axial fluid collections are identified the gray-white differentiation is maintained. No CT findings for acute hemispheric infarction or intracranial hemorrhage.   There is a 3.5 cm posterior fossa cyst, likely benign arachnoid cyst. There is slight mass effect on the occipital lobe and cerebellar vermis in the midline. If  patient has any symptoms referable to this area a neurosurgical consultation may be helpful.   Vascular: No vascular calcifications, aneurysm or hyperdense vessels.   Skull: No skull fracture or bone lesions.   Sinuses/Orbits: The paranasal sinuses and mastoid air cells are clear.   Other: No scalp lesions or scalp hematoma.   IMPRESSION: 1. No acute intracranial findings. 2. 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     Electronically Signed   By: Marijo Sanes M.D.   On: 10/04/2022 16:28  Results for orders placed or performed in visit on 08/22/22  COMPLETE METABOLIC PANEL WITH GFR  Result  Value Ref Range   Glucose, Bld 90 65 - 99 mg/dL   BUN 16 7 - 25 mg/dL   Creat 0.79 0.60 - 1.26 mg/dL   eGFR 121 > OR = 60 mL/min/1.78m   BUN/Creatinine Ratio SEE NOTE: 6 - 22 (calc)   Sodium 138 135 - 146 mmol/L   Potassium 4.2 3.5 - 5.3 mmol/L   Chloride 103 98 - 110 mmol/L   CO2 27 20 - 32 mmol/L   Calcium 9.6 8.6 - 10.3 mg/dL   Total Protein 7.7 6.1 - 8.1 g/dL   Albumin 4.5 3.6 - 5.1 g/dL   Globulin 3.2 1.9 - 3.7 g/dL (calc)   AG Ratio 1.4 1.0 - 2.5 (calc)   Total Bilirubin 0.7 0.2 - 1.2 mg/dL   Alkaline phosphatase (APISO) 83 36 - 130 U/L   AST 19 10 - 40 U/L   ALT 33 9 - 46 U/L  CBC with Differential/Platelet  Result Value Ref Range   WBC 7.4 3.8 - 10.8 Thousand/uL   RBC 5.16 4.20 - 5.80 Million/uL   Hemoglobin 15.7 13.2 - 17.1 g/dL   HCT 46.2 38.5 - 50.0 %   MCV 89.5 80.0 - 100.0 fL   MCH 30.4 27.0 - 33.0 pg   MCHC 34.0 32.0 - 36.0 g/dL   RDW 12.0 11.0 - 15.0 %   Platelets 223 140 - 400 Thousand/uL   MPV 10.9 7.5 - 12.5 fL   Neutro Abs 4,492 1,500 - 7,800 cells/uL   Lymphs Abs 2,375 850 - 3,900 cells/uL   Absolute Monocytes 437 200 - 950 cells/uL   Eosinophils Absolute 67 15 - 500 cells/uL   Basophils Absolute 30 0 - 200 cells/uL   Neutrophils Relative % 60.7 %   Total Lymphocyte 32.1 %   Monocytes Relative 5.9 %   Eosinophils Relative 0.9 %    Basophils Relative 0.4 %  Lipid panel  Result Value Ref Range   Cholesterol 193 <200 mg/dL   HDL 53 > OR = 40 mg/dL   Triglycerides 121 <150 mg/dL   LDL Cholesterol (Calc) 117 (H) mg/dL (calc)   Total CHOL/HDL Ratio 3.6 <5.0 (calc)   Non-HDL Cholesterol (Calc) 140 (H) <130 mg/dL (calc)  Hemoglobin A1c  Result Value Ref Range   Hgb A1c MFr Bld 5.4 <5.7 % of total Hgb   Mean Plasma Glucose 108 mg/dL   eAG (mmol/L) 6.0 mmol/L  TSH  Result Value Ref Range   TSH 1.63 0.40 - 4.50 mIU/L      Assessment & Plan:   Problem List Items Addressed This Visit   None Visit Diagnoses     Headache with neurologic deficit    -  Primary   Relevant Medications   predniSONE (DELTASONE) 20 MG tablet   gabapentin (NEURONTIN) 100 MG capsule   Other Relevant Orders   COMPLETE METABOLIC PANEL WITH GFR   CBC with Differential/Platelet   CT HEAD WO CONTRAST (5MM)   Purpura (HCC)       Relevant Orders   CBC with Differential/Platelet   IgA   Paresthesia of right upper extremity       Relevant Medications   gabapentin (NEURONTIN) 100 MG capsule   Other Relevant Orders   CT HEAD WO CONTRAST (5MM)   Paresthesia of left lower extremity       Relevant Medications   gabapentin (NEURONTIN) 100 MG capsule   Other Relevant Orders   CT HEAD WO CONTRAST (5MM)       Labs today,  stay tuned.  Suspected nerve impingement for paresthesia RUE and LLE Headache is uncertain could be migraine with neurologic symptoms or other etiology, seems HAs resolved now  No physical injury or trauma  No focal neurological persistent deficit, other than pain with paresthesia.  Start Prednisone 1 week taper for nerve pain inflammation. This is a strong anti inflammatory, can raise BP and blood sugar temporarily. HOLD ALL Advil ibuprofen aleve while on this medication. Okay to take Tylenol  Also start now nerve pain pill  Start Gabapentin '100mg'$  capsules, take at night for 2-3 nights only, and then increase to 2  times a day for a few days, and then may increase to 3 times a day, it may make you drowsy, if helps significantly at night only, then you can increase instead to 3 capsules at night, instead of 3 times a day - In the future if needed, we can significantly increase the dose if tolerated well, some common doses are '300mg'$  three times a day up to '600mg'$  three times a day, usually it takes several weeks or months to get to higher doses   CT Head imaging ordered STAT today.  **Update today 2/27 530pm with results. See above. No acute abnormality. There is a benign appearing 3.5 cm posterior cyst, that may be contributing to some symptoms.  I called patient and discussed results with him. He picked up medications and will proceed w/ treatment plan. Likely nerve entrapment impingement Referral submitted now to [facility] Neurology for further management and diagnostic May warrant further imaging MRI based on this cyst. Or Neurosurg in future. If indicated.  [facility] at [address] [place],  [place]  [address]  Main: [phone]  Stay tuned for scheduling to call.  AFTER Imaging is done I can submit a referral to the Neurologist if you are not improving. Let me know update in 1 week  ------  Labs today.   If headaches return, try the sample migraine medication. Nurtec ODT dissolving tablet, take as soon as severe headache, one dose and done for 48 hours.   Orders Placed This Encounter  Procedures   CT HEAD WO CONTRAST (5MM)    Standing Status:   Future    Number of Occurrences:   1    Standing Expiration Date:   10/05/2023    Order Specific Question:   Preferred imaging location?    Answer:   [facility]    Order Specific Question:   Call Results- Best Contact Number?    Answer:   [phone]   COMPLETE METABOLIC PANEL WITH GFR   CBC with Differential/Platelet   IgA   Ambulatory referral to Neurology    Referral  Priority:   Routine    Referral Type:   Consultation    Referral Reason:   Specialty Services Required    Requested Specialty:   Neurology    Number of Visits Requested:   1     Meds ordered this encounter  Medications   predniSONE (DELTASONE) 20 MG tablet    Sig: Take daily with food. Start with '60mg'$  (3 pills) x 2 days, then reduce to '40mg'$  (2 pills) x 2 days, then '20mg'$  (1 pill) x 3 days    Dispense:  13 tablet    Refill:  0   gabapentin (NEURONTIN) 100 MG capsule    Sig: Start 1 capsule daily at bedtime, increase by 1 cap every 2-3 days as tolerated up to 3 times  a day, or may take 3 at once in evening.    Dispense:  90 capsule    Refill:  1     Follow up plan: Return if symptoms worsen or fail to improve.   Nobie Putnam, [facility] 10/04/2022, 8:47 AM

## 2022-10-04 NOTE — Addendum Note (Signed)
Addended by: Olin Hauser on: 10/04/2022 06:07 PM   Modules accepted: Orders

## 2022-10-04 NOTE — Patient Instructions (Addendum)
Thank you for coming to the office today.  Labs today, stay tuned.  Start Prednisone 1 week taper for nerve pain inflammation. This is a strong anti inflammatory, can raise BP and blood sugar temporarily. HOLD ALL Advil ibuprofen aleve while on this medication. Okay to take Tylenol  Also start now nerve pain pill  Start Gabapentin '100mg'$  capsules, take at night for 2-3 nights only, and then increase to 2 times a day for a few days, and then may increase to 3 times a day, it may make you drowsy, if helps significantly at night only, then you can increase instead to 3 capsules at night, instead of 3 times a day - In the future if needed, we can significantly increase the dose if tolerated well, some common doses are '300mg'$  three times a day up to '600mg'$  three times a day, usually it takes several weeks or months to get to higher doses   CT Head imaging ordered.  [facility] at [place]  [address]  Main: [phone]  Stay tuned for scheduling to call.  AFTER Imaging is done I can submit a referral to the Neurologist if you are not improving. Let me know update in 1 week  ------  Labs today.   If headaches return, try the sample migraine medication. Nurtec ODT dissolving tablet, take as soon as severe headache, one dose and done for 48 hours.   Please schedule a Follow-up Appointment to: Return if symptoms worsen or fail to improve.  If you have any other questions or concerns, please feel free to call the office or send a message through [place]. You may also schedule an earlier appointment if necessary.  Additionally, you may be receiving a survey about your experience at our office within a few days to 1 week by e-mail or mail. We value your feedback.  Nobie Putnam, DO [place]

## 2022-10-05 LAB — CBC WITH DIFFERENTIAL/PLATELET
Absolute Monocytes: 384 cells/uL (ref 200–950)
Basophils Absolute: 31 cells/uL (ref 0–200)
Basophils Relative: 0.5 %
Eosinophils Absolute: 81 cells/uL (ref 15–500)
Eosinophils Relative: 1.3 %
HCT: 44.6 % (ref 38.5–50.0)
Hemoglobin: 15.4 g/dL (ref 13.2–17.1)
Lymphs Abs: 1860 cells/uL (ref 850–3900)
MCH: 30.3 pg (ref 27.0–33.0)
MCHC: 34.5 g/dL (ref 32.0–36.0)
MCV: 87.8 fL (ref 80.0–100.0)
MPV: 11 fL (ref 7.5–12.5)
Monocytes Relative: 6.2 %
Neutro Abs: 3844 cells/uL (ref 1500–7800)
Neutrophils Relative %: 62 %
Platelets: 229 10*3/uL (ref 140–400)
RBC: 5.08 10*6/uL (ref 4.20–5.80)
RDW: 11.8 % (ref 11.0–15.0)
Total Lymphocyte: 30 %
WBC: 6.2 10*3/uL (ref 3.8–10.8)

## 2022-10-05 LAB — COMPLETE METABOLIC PANEL WITH GFR
AG Ratio: 1.7 (calc) (ref 1.0–2.5)
ALT: 25 U/L (ref 9–46)
AST: 15 U/L (ref 10–40)
Albumin: 4.7 g/dL (ref 3.6–5.1)
Alkaline phosphatase (APISO): 87 U/L (ref 36–130)
BUN: 16 mg/dL (ref 7–25)
CO2: 27 mmol/L (ref 20–32)
Calcium: 9.5 mg/dL (ref 8.6–10.3)
Chloride: 105 mmol/L (ref 98–110)
Creat: 0.81 mg/dL (ref 0.60–1.26)
Globulin: 2.8 g/dL (calc) (ref 1.9–3.7)
Glucose, Bld: 102 mg/dL — ABNORMAL HIGH (ref 65–99)
Potassium: 4 mmol/L (ref 3.5–5.3)
Sodium: 139 mmol/L (ref 135–146)
Total Bilirubin: 0.8 mg/dL (ref 0.2–1.2)
Total Protein: 7.5 g/dL (ref 6.1–8.1)
eGFR: 120 mL/min/{1.73_m2} (ref >=60)

## 2022-10-05 LAB — IGA: Immunoglobulin A: 241 mg/dL (ref 47–310)

## 2022-11-03 DIAGNOSIS — R202 Paresthesia of skin: Secondary | ICD-10-CM | POA: Diagnosis not present

## 2022-11-03 DIAGNOSIS — G930 Cerebral cysts: Secondary | ICD-10-CM | POA: Diagnosis not present

## 2022-11-03 DIAGNOSIS — R200 Anesthesia of skin: Secondary | ICD-10-CM | POA: Diagnosis not present

## 2022-11-03 DIAGNOSIS — R519 Headache, unspecified: Secondary | ICD-10-CM | POA: Diagnosis not present

## 2022-11-04 ENCOUNTER — Other Ambulatory Visit: Payer: Self-pay | Admitting: Student

## 2022-11-04 DIAGNOSIS — R202 Paresthesia of skin: Secondary | ICD-10-CM

## 2022-11-04 DIAGNOSIS — G930 Cerebral cysts: Secondary | ICD-10-CM

## 2022-11-04 DIAGNOSIS — R519 Headache, unspecified: Secondary | ICD-10-CM

## 2022-11-18 ENCOUNTER — Ambulatory Visit
Admission: RE | Admit: 2022-11-18 | Discharge: 2022-11-18 | Disposition: A | Discharge status: Home / Self Care | Payer: BC Managed Care – PPO | Source: Ambulatory Visit | Attending: Student | Admitting: Student

## 2022-11-18 DIAGNOSIS — R200 Anesthesia of skin: Secondary | ICD-10-CM

## 2022-11-18 DIAGNOSIS — G930 Cerebral cysts: Secondary | ICD-10-CM

## 2022-11-18 DIAGNOSIS — R202 Paresthesia of skin: Secondary | ICD-10-CM

## 2022-11-18 DIAGNOSIS — R519 Headache, unspecified: Secondary | ICD-10-CM | POA: Diagnosis not present

## 2022-11-30 ENCOUNTER — Other Ambulatory Visit: Payer: Self-pay | Admitting: Family Medicine

## 2022-11-30 DIAGNOSIS — R202 Paresthesia of skin: Secondary | ICD-10-CM

## 2022-11-30 DIAGNOSIS — R29818 Other symptoms and signs involving the nervous system: Secondary | ICD-10-CM

## 2022-11-30 NOTE — Telephone Encounter (Signed)
Requested Prescriptions  Pending Prescriptions Disp Refills   gabapentin (NEURONTIN) 100 MG capsule [Pharmacy Med Name: GABAPENTIN  CAPSULES] 90 capsule 2    Sig: TAKE ONE CAPSULE BY MOUTH AT BEDTIME. INCREASE BY 1 CAPSULE EVERY 2-3 DAYS UP TO THREE TIMES DAILY OR 3 CAPSULES AT ONCE IN THE EVENING     Neurology: Anticonvulsants - gabapentin Passed - 11/30/2022  3:14 AM      Passed - Cr in normal range and within 360 days    Creat  Date Value Ref Range Status  10/04/2022 0.81 0.60 - 1.26 mg/dL Final         Passed - Completed PHQ-2 or PHQ-9 in the last 360 days      Passed - Valid encounter within last 12 months    Recent Outpatient Visits           1 month ago Headache with neurologic deficit   [facility], Netta Neat, DO   3 months ago Annual physical exam   [place] [facility] Smitty Cords, DO   9 months ago Paresthesia of right upper extremity   [place] [facility] Smitty Cords, DO   1 year ago Annual physical exam   [place] [facility] Smitty Cords, DO   2 years ago Annual physical exam   [place] [facility] Smitty Cords, DO       Future Appointments             In 9 months Althea Charon, Netta Neat, DO [place] [facility]

## 2022-12-13 DIAGNOSIS — R200 Anesthesia of skin: Secondary | ICD-10-CM | POA: Diagnosis not present

## 2022-12-19 DIAGNOSIS — G930 Cerebral cysts: Secondary | ICD-10-CM | POA: Diagnosis not present

## 2022-12-19 DIAGNOSIS — R200 Anesthesia of skin: Secondary | ICD-10-CM | POA: Diagnosis not present

## 2022-12-19 DIAGNOSIS — R519 Headache, unspecified: Secondary | ICD-10-CM | POA: Diagnosis not present

## 2022-12-19 DIAGNOSIS — R202 Paresthesia of skin: Secondary | ICD-10-CM | POA: Diagnosis not present

## 2023-05-22 DIAGNOSIS — M62830 Muscle spasm of back: Secondary | ICD-10-CM | POA: Diagnosis not present

## 2023-05-22 DIAGNOSIS — M9904 Segmental and somatic dysfunction of sacral region: Secondary | ICD-10-CM | POA: Diagnosis not present

## 2023-05-22 DIAGNOSIS — M5416 Radiculopathy, lumbar region: Secondary | ICD-10-CM | POA: Diagnosis not present

## 2023-05-22 DIAGNOSIS — M9903 Segmental and somatic dysfunction of lumbar region: Secondary | ICD-10-CM | POA: Diagnosis not present

## 2023-05-24 DIAGNOSIS — M62830 Muscle spasm of back: Secondary | ICD-10-CM | POA: Diagnosis not present

## 2023-05-24 DIAGNOSIS — M5416 Radiculopathy, lumbar region: Secondary | ICD-10-CM | POA: Diagnosis not present

## 2023-05-24 DIAGNOSIS — M9903 Segmental and somatic dysfunction of lumbar region: Secondary | ICD-10-CM | POA: Diagnosis not present

## 2023-05-24 DIAGNOSIS — M9904 Segmental and somatic dysfunction of sacral region: Secondary | ICD-10-CM | POA: Diagnosis not present

## 2023-05-25 DIAGNOSIS — M9904 Segmental and somatic dysfunction of sacral region: Secondary | ICD-10-CM | POA: Diagnosis not present

## 2023-05-25 DIAGNOSIS — M9903 Segmental and somatic dysfunction of lumbar region: Secondary | ICD-10-CM | POA: Diagnosis not present

## 2023-05-25 DIAGNOSIS — M5416 Radiculopathy, lumbar region: Secondary | ICD-10-CM | POA: Diagnosis not present

## 2023-05-25 DIAGNOSIS — M62830 Muscle spasm of back: Secondary | ICD-10-CM | POA: Diagnosis not present

## 2023-05-29 DIAGNOSIS — M5416 Radiculopathy, lumbar region: Secondary | ICD-10-CM | POA: Diagnosis not present

## 2023-05-29 DIAGNOSIS — M62830 Muscle spasm of back: Secondary | ICD-10-CM | POA: Diagnosis not present

## 2023-05-29 DIAGNOSIS — M9904 Segmental and somatic dysfunction of sacral region: Secondary | ICD-10-CM | POA: Diagnosis not present

## 2023-05-29 DIAGNOSIS — M9903 Segmental and somatic dysfunction of lumbar region: Secondary | ICD-10-CM | POA: Diagnosis not present

## 2023-05-31 DIAGNOSIS — M62830 Muscle spasm of back: Secondary | ICD-10-CM | POA: Diagnosis not present

## 2023-05-31 DIAGNOSIS — M9904 Segmental and somatic dysfunction of sacral region: Secondary | ICD-10-CM | POA: Diagnosis not present

## 2023-05-31 DIAGNOSIS — M5416 Radiculopathy, lumbar region: Secondary | ICD-10-CM | POA: Diagnosis not present

## 2023-05-31 DIAGNOSIS — M9903 Segmental and somatic dysfunction of lumbar region: Secondary | ICD-10-CM | POA: Diagnosis not present

## 2023-06-01 DIAGNOSIS — M62830 Muscle spasm of back: Secondary | ICD-10-CM | POA: Diagnosis not present

## 2023-06-01 DIAGNOSIS — M5416 Radiculopathy, lumbar region: Secondary | ICD-10-CM | POA: Diagnosis not present

## 2023-06-01 DIAGNOSIS — M9903 Segmental and somatic dysfunction of lumbar region: Secondary | ICD-10-CM | POA: Diagnosis not present

## 2023-06-01 DIAGNOSIS — M9904 Segmental and somatic dysfunction of sacral region: Secondary | ICD-10-CM | POA: Diagnosis not present

## 2023-06-05 DIAGNOSIS — M62830 Muscle spasm of back: Secondary | ICD-10-CM | POA: Diagnosis not present

## 2023-06-05 DIAGNOSIS — M9904 Segmental and somatic dysfunction of sacral region: Secondary | ICD-10-CM | POA: Diagnosis not present

## 2023-06-05 DIAGNOSIS — M9903 Segmental and somatic dysfunction of lumbar region: Secondary | ICD-10-CM | POA: Diagnosis not present

## 2023-06-05 DIAGNOSIS — M5416 Radiculopathy, lumbar region: Secondary | ICD-10-CM | POA: Diagnosis not present

## 2023-06-07 DIAGNOSIS — M5416 Radiculopathy, lumbar region: Secondary | ICD-10-CM | POA: Diagnosis not present

## 2023-06-07 DIAGNOSIS — M9904 Segmental and somatic dysfunction of sacral region: Secondary | ICD-10-CM | POA: Diagnosis not present

## 2023-06-07 DIAGNOSIS — M9903 Segmental and somatic dysfunction of lumbar region: Secondary | ICD-10-CM | POA: Diagnosis not present

## 2023-06-07 DIAGNOSIS — M62830 Muscle spasm of back: Secondary | ICD-10-CM | POA: Diagnosis not present

## 2023-06-08 DIAGNOSIS — M62830 Muscle spasm of back: Secondary | ICD-10-CM | POA: Diagnosis not present

## 2023-06-08 DIAGNOSIS — M9903 Segmental and somatic dysfunction of lumbar region: Secondary | ICD-10-CM | POA: Diagnosis not present

## 2023-06-08 DIAGNOSIS — M9904 Segmental and somatic dysfunction of sacral region: Secondary | ICD-10-CM | POA: Diagnosis not present

## 2023-06-08 DIAGNOSIS — M5416 Radiculopathy, lumbar region: Secondary | ICD-10-CM | POA: Diagnosis not present

## 2023-06-13 DIAGNOSIS — M5416 Radiculopathy, lumbar region: Secondary | ICD-10-CM | POA: Diagnosis not present

## 2023-06-13 DIAGNOSIS — M9904 Segmental and somatic dysfunction of sacral region: Secondary | ICD-10-CM | POA: Diagnosis not present

## 2023-06-13 DIAGNOSIS — M9903 Segmental and somatic dysfunction of lumbar region: Secondary | ICD-10-CM | POA: Diagnosis not present

## 2023-06-13 DIAGNOSIS — M62830 Muscle spasm of back: Secondary | ICD-10-CM | POA: Diagnosis not present

## 2023-06-15 DIAGNOSIS — M5416 Radiculopathy, lumbar region: Secondary | ICD-10-CM | POA: Diagnosis not present

## 2023-06-15 DIAGNOSIS — M9903 Segmental and somatic dysfunction of lumbar region: Secondary | ICD-10-CM | POA: Diagnosis not present

## 2023-06-15 DIAGNOSIS — M62830 Muscle spasm of back: Secondary | ICD-10-CM | POA: Diagnosis not present

## 2023-06-15 DIAGNOSIS — M9904 Segmental and somatic dysfunction of sacral region: Secondary | ICD-10-CM | POA: Diagnosis not present

## 2023-06-19 DIAGNOSIS — M62830 Muscle spasm of back: Secondary | ICD-10-CM | POA: Diagnosis not present

## 2023-06-19 DIAGNOSIS — M9904 Segmental and somatic dysfunction of sacral region: Secondary | ICD-10-CM | POA: Diagnosis not present

## 2023-06-19 DIAGNOSIS — M9903 Segmental and somatic dysfunction of lumbar region: Secondary | ICD-10-CM | POA: Diagnosis not present

## 2023-06-19 DIAGNOSIS — M5416 Radiculopathy, lumbar region: Secondary | ICD-10-CM | POA: Diagnosis not present

## 2023-06-22 DIAGNOSIS — M9903 Segmental and somatic dysfunction of lumbar region: Secondary | ICD-10-CM | POA: Diagnosis not present

## 2023-06-22 DIAGNOSIS — M5416 Radiculopathy, lumbar region: Secondary | ICD-10-CM | POA: Diagnosis not present

## 2023-06-22 DIAGNOSIS — M62830 Muscle spasm of back: Secondary | ICD-10-CM | POA: Diagnosis not present

## 2023-06-22 DIAGNOSIS — M9904 Segmental and somatic dysfunction of sacral region: Secondary | ICD-10-CM | POA: Diagnosis not present

## 2023-06-28 DIAGNOSIS — M5416 Radiculopathy, lumbar region: Secondary | ICD-10-CM | POA: Diagnosis not present

## 2023-06-28 DIAGNOSIS — M62830 Muscle spasm of back: Secondary | ICD-10-CM | POA: Diagnosis not present

## 2023-06-28 DIAGNOSIS — M9903 Segmental and somatic dysfunction of lumbar region: Secondary | ICD-10-CM | POA: Diagnosis not present

## 2023-06-28 DIAGNOSIS — M9904 Segmental and somatic dysfunction of sacral region: Secondary | ICD-10-CM | POA: Diagnosis not present

## 2023-07-12 DIAGNOSIS — M5416 Radiculopathy, lumbar region: Secondary | ICD-10-CM | POA: Diagnosis not present

## 2023-07-12 DIAGNOSIS — M62830 Muscle spasm of back: Secondary | ICD-10-CM | POA: Diagnosis not present

## 2023-07-12 DIAGNOSIS — M9903 Segmental and somatic dysfunction of lumbar region: Secondary | ICD-10-CM | POA: Diagnosis not present

## 2023-07-12 DIAGNOSIS — M9904 Segmental and somatic dysfunction of sacral region: Secondary | ICD-10-CM | POA: Diagnosis not present

## 2023-08-21 ENCOUNTER — Other Ambulatory Visit: Payer: Self-pay | Admitting: Family Medicine

## 2023-08-21 DIAGNOSIS — Z0000 Encounter for general adult medical examination without abnormal findings: Secondary | ICD-10-CM

## 2023-08-21 DIAGNOSIS — E66811 Obesity, class 1: Secondary | ICD-10-CM

## 2023-08-21 DIAGNOSIS — R7309 Other abnormal glucose: Secondary | ICD-10-CM

## 2023-08-21 DIAGNOSIS — E7800 Pure hypercholesterolemia, unspecified: Secondary | ICD-10-CM

## 2023-08-23 ENCOUNTER — Other Ambulatory Visit: Payer: BC Managed Care – PPO

## 2023-08-24 LAB — TSH: TSH: 2.21 m[IU]/L (ref 0.40–4.50)

## 2023-08-24 LAB — CBC WITH DIFFERENTIAL/PLATELET
Absolute Lymphocytes: 1769 {cells}/uL (ref 850–3900)
Absolute Monocytes: 409 {cells}/uL (ref 200–950)
Basophils Absolute: 40 {cells}/uL (ref 0–200)
Basophils Relative: 0.6 %
Eosinophils Absolute: 92 {cells}/uL (ref 15–500)
Eosinophils Relative: 1.4 %
HCT: 43 % (ref 38.5–50.0)
Hemoglobin: 14.5 g/dL (ref 13.2–17.1)
MCH: 30.3 pg (ref 27.0–33.0)
MCHC: 33.7 g/dL (ref 32.0–36.0)
MCV: 89.8 fL (ref 80.0–100.0)
MPV: 10.5 fL (ref 7.5–12.5)
Monocytes Relative: 6.2 %
Neutro Abs: 4290 {cells}/uL (ref 1500–7800)
Neutrophils Relative %: 65 %
Platelets: 216 10*3/uL (ref 140–400)
RBC: 4.79 10*6/uL (ref 4.20–5.80)
RDW: 11.8 % (ref 11.0–15.0)
Total Lymphocyte: 26.8 %
WBC: 6.6 10*3/uL (ref 3.8–10.8)

## 2023-08-24 LAB — COMPLETE METABOLIC PANEL WITH GFR
AG Ratio: 1.4 (calc) (ref 1.0–2.5)
ALT: 30 U/L (ref 9–46)
AST: 16 U/L (ref 10–40)
Albumin: 4.2 g/dL (ref 3.6–5.1)
Alkaline phosphatase (APISO): 87 U/L (ref 36–130)
BUN: 15 mg/dL (ref 7–25)
CO2: 28 mmol/L (ref 20–32)
Calcium: 9.1 mg/dL (ref 8.6–10.3)
Chloride: 105 mmol/L (ref 98–110)
Creat: 0.74 mg/dL (ref 0.60–1.26)
Globulin: 3.1 g/dL (ref 1.9–3.7)
Glucose, Bld: 95 mg/dL (ref 65–99)
Potassium: 4.2 mmol/L (ref 3.5–5.3)
Sodium: 138 mmol/L (ref 135–146)
Total Bilirubin: 0.7 mg/dL (ref 0.2–1.2)
Total Protein: 7.3 g/dL (ref 6.1–8.1)
eGFR: 123 mL/min/{1.73_m2} (ref >=60)

## 2023-08-24 LAB — HEMOGLOBIN A1C
Hgb A1c MFr Bld: 5.4 %{Hb} (ref <5.7)
Mean Plasma Glucose: 108 mg/dL
eAG (mmol/L): 6 mmol/L

## 2023-08-24 LAB — LIPID PANEL
Cholesterol: 201 mg/dL — ABNORMAL HIGH (ref <200)
HDL: 53 mg/dL (ref >=40)
LDL Cholesterol (Calc): 133 mg/dL — ABNORMAL HIGH
Non-HDL Cholesterol (Calc): 148 mg/dL — ABNORMAL HIGH (ref <130)
Total CHOL/HDL Ratio: 3.8 (calc) (ref <5.0)
Triglycerides: 60 mg/dL (ref <150)

## 2023-08-28 ENCOUNTER — Other Ambulatory Visit: Payer: Self-pay | Admitting: Family Medicine

## 2023-08-28 ENCOUNTER — Encounter: Payer: Self-pay | Admitting: Family Medicine

## 2023-08-28 ENCOUNTER — Ambulatory Visit (INDEPENDENT_AMBULATORY_CARE_PROVIDER_SITE_OTHER): Payer: BC Managed Care – PPO | Admitting: Family Medicine

## 2023-08-28 VITALS — BP 118/84 | HR 71 | Ht 71.0 in | Wt 231.0 lb

## 2023-08-28 DIAGNOSIS — E66811 Obesity, class 1: Secondary | ICD-10-CM | POA: Diagnosis not present

## 2023-08-28 DIAGNOSIS — R7309 Other abnormal glucose: Secondary | ICD-10-CM

## 2023-08-28 DIAGNOSIS — E7800 Pure hypercholesterolemia, unspecified: Secondary | ICD-10-CM

## 2023-08-28 DIAGNOSIS — Z0000 Encounter for general adult medical examination without abnormal findings: Secondary | ICD-10-CM

## 2023-08-28 NOTE — Progress Notes (Addendum)
Subjective:    Patient ID: Jeffery Krueger, male    DOB: 06-Dec-1990, 33 y.o.   MRN: [id number]  Jeffery Krueger is a 33 y.o. male presenting on 08/28/2023 for Annual Exam   HPI  Discussed the use of AI scribe software for clinical note transcription with the patient, who gave verbal consent to proceed.  History of Present Illness        Here for Annual Physical and Lab Review   Obesity BMI >32 / Elevated LDL / Lifestyle Overall feels good and doing well with lifestyle improvement He has cut out all soda and beer, improving lifestyle, goal to limit fried. Improving gym exercise Lab shows LDL up to 133, some elevation prior 110-117 range A1c controlled  Weight stable 230 lbs  Environmental Allergies Seasonal   Former Smoker - 02/2019    Health Maintenance: Declined Flu Shot today  TDap 2023     08/28/2023    8:28 AM 08/22/2022   10:29 AM 02/28/2022    4:01 PM  Depression screen PHQ 2/9  Decreased Interest 1 1 1   Down, Depressed, Hopeless 1 1 1   PHQ - 2 Score 2 2 2   Altered sleeping 0 1 2  Tired, decreased energy 1 2 1   Change in appetite 0 2 1  Feeling bad or failure about yourself  0 2 1  Trouble concentrating 0 2 0  Moving slowly or fidgety/restless 0 1 0  Suicidal thoughts 0 0 0  PHQ-9 Score 3 12 7   Difficult doing work/chores  Very difficult Somewhat difficult       08/28/2023    8:28 AM 08/22/2022   10:29 AM 02/28/2022    4:01 PM 08/23/2021    2:17 PM  GAD 7 : Generalized Anxiety Score  Nervous, Anxious, on Edge 0 0 1 1  Control/stop worrying 0 1 1 1   Worry too much - different things 0 1 1 1   Trouble relaxing 0 1 1 0  Restless 0 1 1 1   Easily annoyed or irritable 1 2 1 1   Afraid - awful might happen 0 0 0 0  Total GAD 7 Score 1 6 6 5   Anxiety Difficulty  Not difficult at all Not difficult at all Not difficult at all     History reviewed. No pertinent past medical history. Past Surgical History:  Procedure Laterality Date   EYE SURGERY      Social History   Socioeconomic History   Marital status: Single    Spouse name: Not on file   Number of children: 1   Years of education: College   Highest education level: Bachelor's degree (e.g., BA, AB, BS)  Occupational History   Occupation: [employer]  Tobacco Use   Smoking status: Former    Current packs/day: 0.00    Types: Cigarettes    Quit date: 02/06/2019    Years since quitting: 4.5   Smokeless tobacco: Former  Building services engineer status: Never Used  Substance and Sexual Activity   Alcohol use: Yes    Alcohol/week: 5.0 standard drinks of alcohol    Types: 5 Cans of beer per week   Drug use: Never   Sexual activity: Not on file  Other Topics Concern   Not on file  Social History Narrative   Not on file   Social Drivers of Health   Financial Resource Strain: Not on file  Food Insecurity: Not on file  Transportation Needs: Not on file  Physical  Activity: Not on file  Stress: Not on file  Social Connections: Not on file  Intimate Partner Violence: Not At Risk (02/15/2023)   Received from [facility] [place], [place], [place] and [facility] [facility]     Are you in a relationship with someone who hurts you emotionally and/or physically?: No   Family History  Problem Relation Age of Onset   Prostate cancer Neg Hx    Colon cancer Neg Hx    Diabetes Neg Hx    No current outpatient medications on file prior to visit.   No current facility-administered medications on file prior to visit.    Review of Systems  Constitutional:  Negative for activity change, appetite change, chills, diaphoresis, fatigue and fever.  HENT:  Negative for congestion and hearing loss.   Eyes:  Negative for visual disturbance.  Respiratory:  Negative for cough, chest tightness, shortness of breath and wheezing.   Cardiovascular:  Negative for chest pain, palpitations and leg swelling.  Gastrointestinal:  Negative for abdominal pain, constipation, diarrhea,  nausea and vomiting.  Genitourinary:  Negative for dysuria, frequency and hematuria.  Musculoskeletal:  Negative for arthralgias and neck pain.  Skin:  Negative for rash.  Neurological:  Negative for dizziness, weakness, light-headedness, numbness and headaches.  Hematological:  Negative for adenopathy.  Psychiatric/Behavioral:  Negative for behavioral problems, dysphoric mood and sleep disturbance.    Per HPI unless specifically indicated above     Objective:    BP 118/84   Pulse 71   Ht 5\' 11"  (1.803 m)   Wt 231 lb (104.8 kg)   SpO2 96%   BMI 32.22 kg/m   Wt Readings from Last 3 Encounters:  08/28/23 231 lb (104.8 kg)  10/04/22 232 lb (105.2 kg)  09/30/22 230 lb (104.3 kg)    Physical Exam Vitals and nursing note reviewed.  Constitutional:      General: He is not in acute distress.    Appearance: He is well-developed. He is not diaphoretic.     Comments: Well-appearing, comfortable, cooperative  HENT:     Head: Normocephalic and atraumatic.     Right Ear: Tympanic membrane, ear canal and external ear normal. There is no impacted cerumen.     Left Ear: Tympanic membrane, ear canal and external ear normal. There is no impacted cerumen.  Eyes:     General:        Right eye: No discharge.        Left eye: No discharge.     Conjunctiva/sclera: Conjunctivae normal.     Pupils: Pupils are equal, round, and reactive to light.  Neck:     Thyroid: No thyromegaly.     Vascular: No carotid bruit.  Cardiovascular:     Rate and Rhythm: Normal rate and regular rhythm.     Pulses: Normal pulses.     Heart sounds: Normal heart sounds. No murmur heard. Pulmonary:     Effort: Pulmonary effort is normal. No respiratory distress.     Breath sounds: Normal breath sounds. No wheezing or rales.  Abdominal:     General: Bowel sounds are normal. There is no distension.     Palpations: Abdomen is soft. There is no mass.     Tenderness: There is no abdominal tenderness.  Musculoskeletal:         General: No tenderness. Normal range of motion.     Cervical back: Normal range of motion and neck supple.     Right lower leg:  No edema.     Left lower leg: No edema.     Comments: Upper / Lower Extremities: - Normal muscle tone, strength bilateral upper extremities 5/5, lower extremities 5/5  Lymphadenopathy:     Cervical: No cervical adenopathy.  Skin:    General: Skin is warm and dry.     Findings: No erythema or rash.  Neurological:     Mental Status: He is alert and oriented to person, place, and time.     Comments: Distal sensation intact to light touch all extremities  Psychiatric:        Mood and Affect: Mood normal.        Behavior: Behavior normal.        Thought Content: Thought content normal.     Comments: Well groomed, good eye contact, normal speech and thoughts     Results for orders placed or performed in visit on 08/21/23  Hemoglobin A1c   Collection Time: 08/23/23  8:00 AM  Result Value Ref Range   Hgb A1c MFr Bld 5.4 <5.7 % of total Hgb   Mean Plasma Glucose 108 mg/dL   eAG (mmol/L) 6.0 mmol/L  Lipid panel   Collection Time: 08/23/23  8:00 AM  Result Value Ref Range   Cholesterol 201 (H) <200 mg/dL   HDL 53 > OR = 40 mg/dL   Triglycerides 60 <161 mg/dL   LDL Cholesterol (Calc) 133 (H) mg/dL (calc)   Total CHOL/HDL Ratio 3.8 <5.0 (calc)   Non-HDL Cholesterol (Calc) 148 (H) <130 mg/dL (calc)  COMPLETE METABOLIC PANEL WITH GFR   Collection Time: 08/23/23  8:00 AM  Result Value Ref Range   Glucose, Bld 95 65 - 99 mg/dL   BUN 15 7 - 25 mg/dL   Creat 0.96 0.45 - 4.09 mg/dL   eGFR 811 > OR = 60 BJ/YNW/2.95A2   BUN/Creatinine Ratio SEE NOTE: 6 - 22 (calc)   Sodium 138 135 - 146 mmol/L   Potassium 4.2 3.5 - 5.3 mmol/L   Chloride 105 98 - 110 mmol/L   CO2 28 20 - 32 mmol/L   Calcium 9.1 8.6 - 10.3 mg/dL   Total Protein 7.3 6.1 - 8.1 g/dL   Albumin 4.2 3.6 - 5.1 g/dL   Globulin 3.1 1.9 - 3.7 g/dL (calc)   AG Ratio 1.4 1.0 - 2.5 (calc)   Total  Bilirubin 0.7 0.2 - 1.2 mg/dL   Alkaline phosphatase (APISO) 87 36 - 130 U/L   AST 16 10 - 40 U/L   ALT 30 9 - 46 U/L  CBC with Differential/Platelet   Collection Time: 08/23/23  8:00 AM  Result Value Ref Range   WBC 6.6 3.8 - 10.8 Thousand/uL   RBC 4.79 4.20 - 5.80 Million/uL   Hemoglobin 14.5 13.2 - 17.1 g/dL   HCT 13.0 86.5 - 78.4 %   MCV 89.8 80.0 - 100.0 fL   MCH 30.3 27.0 - 33.0 pg   MCHC 33.7 32.0 - 36.0 g/dL   RDW 69.6 29.5 - 28.4 %   Platelets 216 140 - 400 Thousand/uL   MPV 10.5 7.5 - 12.5 fL   Neutro Abs 4,290 1,500 - 7,800 cells/uL   Absolute Lymphocytes 1,769 850 - 3,900 cells/uL   Absolute Monocytes 409 200 - 950 cells/uL   Eosinophils Absolute 92 15 - 500 cells/uL   Basophils Absolute 40 0 - 200 cells/uL   Neutrophils Relative % 65 %   Total Lymphocyte 26.8 %   Monocytes Relative 6.2 %  Eosinophils Relative 1.4 %   Basophils Relative 0.6 %  TSH   Collection Time: 08/23/23  8:00 AM  Result Value Ref Range   TSH 2.21 0.40 - 4.50 mIU/L      Assessment & Plan:   Problem List Items Addressed This Visit     Obesity (BMI 30.0-34.9)   Other Visit Diagnoses       Annual physical exam    -  Primary     Elevated LDL cholesterol level            Updated Health Maintenance information Reviewed recent lab results with patient Encouraged improvement to lifestyle with diet and exercise Goal of weight loss   Hyperlipidemia LDL cholesterol of 133, with a history of fluctuating levels. Patient has initiated lifestyle modifications including regular gym exercise and dietary changes (cutting out beer and soda). Discussed the impact of weight, diet, and genetics on cholesterol levels. -Encourage continuation of lifestyle modifications including regular exercise and healthy diet. -Set weight loss goal to approximately 200 lbs over the next year. -Recheck cholesterol levels at next annual visit.  General Health Maintenance -Declined flu shot and COVID  booster.    No orders of the defined types were placed in this encounter.   No orders of the defined types were placed in this encounter.    Follow up plan: Return in about 1 year (around 08/27/2024) for 1 year fasting lab > 1 week later Annual Physical.  Future labs 08/19/24  Updated code based on patients age PR PERIODIC PREVENTIVE MED EST PATIENT 18-39 YRS [99395]   Saralyn Pilar, DO [facility] 08/28/2023, 8:07 AM

## 2023-08-28 NOTE — Patient Instructions (Addendum)
Thank you for coming to the office today.  Keep improving diet lifestyle  DUE for FASTING BLOOD WORK (no food or drink after midnight before the lab appointment, only water or coffee without cream/sugar on the morning of)  SCHEDULE "Lab Only" visit in the morning at the clinic for lab draw in 1 YEAR  - Make sure Lab Only appointment is at about 1 week before your next appointment, so that results will be available  For Lab Results, once available within 2-3 days of blood draw, you can can log in to MyChart online to view your results and a brief explanation. Also, we can discuss results at next follow-up visit.   Please schedule a Follow-up Appointment to: Return in about 1 year (around 08/27/2024) for 1 year fasting lab > 1 week later Annual Physical.  If you have any other questions or concerns, please feel free to call the office or send a message through MyChart. You may also schedule an earlier appointment if necessary.  Additionally, you may be receiving a survey about your experience at our office within a few days to 1 week by e-mail or mail. We value your feedback.  Saralyn Pilar, DO [facility], [place]

## 2023-09-06 NOTE — Addendum Note (Signed)
Addended by: Smitty Cords on: 09/06/2023 02:42 PM   Modules accepted: Level of Service

## 2023-10-05 ENCOUNTER — Encounter: Payer: Self-pay | Admitting: Emergency Medicine

## 2023-10-05 ENCOUNTER — Ambulatory Visit
Admission: EM | Admit: 2023-10-05 | Discharge: 2023-10-05 | Disposition: A | Discharge status: Home / Self Care | Payer: BC Managed Care – PPO | Attending: Emergency Medicine | Admitting: Emergency Medicine

## 2023-10-05 DIAGNOSIS — J09X2 Influenza due to identified novel influenza A virus with other respiratory manifestations: Secondary | ICD-10-CM | POA: Insufficient documentation

## 2023-10-05 DIAGNOSIS — J020 Streptococcal pharyngitis: Secondary | ICD-10-CM | POA: Insufficient documentation

## 2023-10-05 LAB — RESP PANEL BY RT-PCR (FLU A&B, COVID) ARPGX2
Influenza A by PCR: POSITIVE — AB
Influenza B by PCR: NEGATIVE
SARS Coronavirus 2 by RT PCR: NEGATIVE

## 2023-10-05 LAB — GROUP A STREP BY PCR: Group A Strep by PCR: DETECTED — AB

## 2023-10-05 MED ORDER — IPRATROPIUM BROMIDE 0.06 % NA SOLN: 2.0000 | Freq: Four times a day (QID) | NASAL | 12 refills | Status: AC

## 2023-10-05 MED ORDER — OSELTAMIVIR PHOSPHATE 75 MG PO CAPS: 75.0000 mg | ORAL_CAPSULE | Freq: Two times a day (BID) | ORAL | 0 refills | Status: DC

## 2023-10-05 MED ORDER — PROMETHAZINE-DM 6.25-15 MG/5ML PO SYRP: 5.0000 mL | ORAL_SOLUTION | Freq: Four times a day (QID) | ORAL | 0 refills | Status: DC | PRN

## 2023-10-05 MED ORDER — AMOXICILLIN-POT CLAVULANATE 875-125 MG PO TABS: 1.0000 | ORAL_TABLET | Freq: Two times a day (BID) | ORAL | 0 refills | Status: AC

## 2023-10-05 MED ORDER — BENZONATATE 100 MG PO CAPS: 200.0000 mg | ORAL_CAPSULE | Freq: Three times a day (TID) | ORAL | 0 refills | Status: DC

## 2023-10-05 NOTE — ED Triage Notes (Signed)
 Pt c/o nausea, chills , fever, cough and sore throat started today his son has the flu.

## 2023-10-05 NOTE — Discharge Instructions (Addendum)
 Take the Augmentin twice daily for 10 days for treatment of your strep throat.  Gargle with warm salt water 2-3 times a day to soothe your throat, aid in pain relief, and aid in healing.  Take over-the-counter ibuprofen according to the package instructions as needed for pain.  You can also use Chloraseptic or Sucrets lozenges, 1 lozenge every 2 hours as needed for throat pain.  Take the Tamiflu twice daily for 5 days for treatment of influenza.  Use the Atrovent nasal spray, 2 squirts up each nostril every 6 hours, as needed for nasal congestion and runny nose.  Use over-the-counter Delsym, Zarbee's, or Robitussin during the day as needed for cough.  Use the Tessalon Perles every 8 hours as needed for cough.  Taken with a small sip of water.  You may experience some numbness to your tongue or metallic taste in your mouth, this is normal.  Use the Promethazine DM cough syrup at bedtime as will make you drowsy but it should help dry up your postnasal drip and aid you in sleep and cough relief.  Return for reevaluation, or see your primary care provider, for new or worsening symptoms.

## 2023-10-05 NOTE — ED Provider Notes (Signed)
 [facility]    CSN: [id number] Arrival date & time: 10/05/23  1733      History   Chief Complaint Chief Complaint  Patient presents with   Nausea   Fever   Chills    HPI Jeffery Krueger is a 33 y.o. male.   HPI  33 year old male with no significant past medical history presents for evaluation of fever, chills, sore throat, cough, nausea that started today.  His son is currently being treated for influenza.  Patient reports Tmax of one 1.2.  History reviewed. No pertinent past medical history.  Patient Active Problem List   Diagnosis Date Noted   Obesity (BMI 30.0-34.9) 08/23/2021   Environmental and seasonal allergies 02/16/2018    Past Surgical History:  Procedure Laterality Date   EYE SURGERY         Home Medications    Prior to Admission medications   Medication Sig Start Date End Date Taking? Authorizing Provider  amoxicillin-clavulanate (AUGMENTIN) 875-125 MG tablet Take 1 tablet by mouth every 12 (twelve) hours for 10 days. 10/05/23 10/15/23 Yes Becky Augusta, NP  benzonatate (TESSALON) 100 MG capsule Take 2 capsules (200 mg total) by mouth every 8 (eight) hours. 10/05/23  Yes Becky Augusta, NP  EPINEPHrine 0.3 mg/0.3 mL IJ SOAJ injection Inject into the muscle. 02/15/23  Yes [provider]  ipratropium (ATROVENT) 0.06 % nasal spray Place 2 sprays into both nostrils 4 (four) times daily. 10/05/23  Yes Becky Augusta, NP  oseltamivir (TAMIFLU) 75 MG capsule Take 1 capsule (75 mg total) by mouth every 12 (twelve) hours. 10/05/23  Yes Becky Augusta, NP  promethazine-dextromethorphan (PROMETHAZINE-DM) 6.25-15 MG/5ML syrup Take 5 mLs by mouth 4 (four) times daily as needed. 10/05/23  Yes Becky Augusta, NP    Family History Family History  Problem Relation Age of Onset   Prostate cancer Neg Hx    Colon cancer Neg Hx    Diabetes Neg Hx     Social History Social History   Tobacco Use   Smoking status: Former    Current packs/day: 0.00    Types:  Cigarettes    Quit date: 02/06/2019    Years since quitting: 4.6   Smokeless tobacco: Former  Building services engineer status: Never Used  Substance Use Topics   Alcohol use: Yes    Alcohol/week: 5.0 standard drinks of alcohol    Types: 5 Cans of beer per week   Drug use: Never     Allergies   Wasp venom protein   Review of Systems Review of Systems  Constitutional:  Positive for fever.  HENT:  Positive for congestion, rhinorrhea and sore throat. Negative for ear pain.   Respiratory:  Positive for cough. Negative for shortness of breath and wheezing.   Gastrointestinal:  Negative for diarrhea, nausea and vomiting.     Physical Exam Triage Vital Signs ED Triage Vitals  Encounter Vitals Group     BP      Systolic BP Percentile      Diastolic BP Percentile      Pulse      Resp      Temp      Temp src      SpO2      Weight      Height      Head Circumference      Peak Flow      Pain Score      Pain Loc      Pain Education  Exclude from Growth Chart    No data found.  Updated Vital Signs BP (!) 162/96 (BP Location: Left Arm)   Pulse 95   Temp 99.3 F (37.4 C) (Oral)   Resp 18   SpO2 96%   Visual Acuity Right Eye Distance:   Left Eye Distance:   Bilateral Distance:    Right Eye Near:   Left Eye Near:    Bilateral Near:     Physical Exam Vitals and nursing note reviewed.  Constitutional:      Appearance: Normal appearance. He is not ill-appearing.  HENT:     Head: Normocephalic and atraumatic.     Right Ear: Tympanic membrane, ear canal and external ear normal. There is no impacted cerumen.     Left Ear: Tympanic membrane, ear canal and external ear normal. There is no impacted cerumen.     Nose: Congestion and rhinorrhea present.     Comments: Patient mucosa is erythematous and edematous with clear discharge in both nares.    Mouth/Throat:     Mouth: Mucous membranes are moist.     Pharynx: Oropharynx is clear. Posterior oropharyngeal erythema  present. No oropharyngeal exudate.     Comments: Tonsillar pillars are erythematous and edematous but free of exudate. Cardiovascular:     Rate and Rhythm: Normal rate and regular rhythm.     Pulses: Normal pulses.     Heart sounds: Normal heart sounds. No murmur heard.    No friction rub. No gallop.  Pulmonary:     Effort: Pulmonary effort is normal.     Breath sounds: Normal breath sounds. No wheezing, rhonchi or rales.  Musculoskeletal:     Cervical back: Normal range of motion and neck supple. No tenderness.  Lymphadenopathy:     Cervical: No cervical adenopathy.  Skin:    General: Skin is warm and dry.     Capillary Refill: Capillary refill takes less than 2 seconds.     Findings: No rash.  Neurological:     General: No focal deficit present.     Mental Status: He is alert and oriented to person, place, and time.      UC Treatments / Results  Labs (all labs ordered are listed, but only abnormal results are displayed) Labs Reviewed  RESP PANEL BY RT-PCR (FLU A&B, COVID) ARPGX2 - Abnormal; Notable for the following components:      Result Value   Influenza A by PCR POSITIVE (*)    All other components within normal limits  GROUP A STREP BY PCR - Abnormal; Notable for the following components:   Group A Strep by PCR DETECTED (*)    All other components within normal limits    EKG   Radiology No results found.  Procedures Procedures (including critical care time)  Medications Ordered in UC Medications - No data to display  Initial Impression / Assessment and Plan / UC Course  I have reviewed the triage vital signs and the nursing notes.  Pertinent labs & imaging results that were available during my care of the patient were reviewed by me and considered in my medical decision making (see chart for details).   Patient is a nontoxic-appearing 33 year old male presenting for evaluation of flulike symptoms in setting of a recent flu exposure as outlined HPI above.   His physical exam does reveal inflammation of his upper respiratory tract as evidenced by inflamed nasal mucosa with copious clear nasal discharge in both nares.  He also is edematous and erythematous  tonsillar pillars without exudate.  No cervical lymphadenopathy on exam.  Cardiopulmonary exam reveals: Sounds in all fields.  Differential diagnose include COVID, influenza, strep pharyngitis, viral respiratory illness.  I will order a COVID and flu PCR as well as a strep PCR.  Strep PCR is positive.  Respiratory panel is positive for influenza A.  I will discharge patient with a diagnosis of influenza A and strep pharyngitis.  I will start him on Tamiflu 75 mg twice daily for 5 days for the influenza and Augmentin 8 and 75 mg twice daily for 10 days for strep pharyngitis.  He may use over-the-counter Tylenol and/or ibuprofen as needed for fever or pain.  Atrovent nasal spray for nasal congestion.  Tessalon Perles and Promethazine DM cough syrup for cough and congestion.  Return precautions reviewed.  Work note provided.   Final Clinical Impressions(s) / UC Diagnoses   Final diagnoses:  Strep pharyngitis  Influenza due to identified novel influenza A virus with other respiratory manifestations     Discharge Instructions      Take the Augmentin twice daily for 10 days for treatment of your strep throat.  Gargle with warm salt water 2-3 times a day to soothe your throat, aid in pain relief, and aid in healing.  Take over-the-counter ibuprofen according to the package instructions as needed for pain.  You can also use Chloraseptic or Sucrets lozenges, 1 lozenge every 2 hours as needed for throat pain.  Take the Tamiflu twice daily for 5 days for treatment of influenza.  Use the Atrovent nasal spray, 2 squirts up each nostril every 6 hours, as needed for nasal congestion and runny nose.  Use over-the-counter Delsym, Zarbee's, or Robitussin during the day as needed for cough.  Use the  Tessalon Perles every 8 hours as needed for cough.  Taken with a small sip of water.  You may experience some numbness to your tongue or metallic taste in your mouth, this is normal.  Use the Promethazine DM cough syrup at bedtime as will make you drowsy but it should help dry up your postnasal drip and aid you in sleep and cough relief.  Return for reevaluation, or see your primary care provider, for new or worsening symptoms.      ED Prescriptions     Medication Sig Dispense Auth. Provider   amoxicillin-clavulanate (AUGMENTIN) 875-125 MG tablet Take 1 tablet by mouth every 12 (twelve) hours for 10 days. 20 tablet Becky Augusta, NP   oseltamivir (TAMIFLU) 75 MG capsule Take 1 capsule (75 mg total) by mouth every 12 (twelve) hours. 10 capsule Becky Augusta, NP   benzonatate (TESSALON) 100 MG capsule Take 2 capsules (200 mg total) by mouth every 8 (eight) hours. 21 capsule Becky Augusta, NP   ipratropium (ATROVENT) 0.06 % nasal spray Place 2 sprays into both nostrils 4 (four) times daily. 15 mL Becky Augusta, NP   promethazine-dextromethorphan (PROMETHAZINE-DM) 6.25-15 MG/5ML syrup Take 5 mLs by mouth 4 (four) times daily as needed. 118 mL Becky Augusta, NP      PDMP not reviewed this encounter.   Becky Augusta, NP 10/05/23 [phone]

## 2024-07-25 NOTE — Telephone Encounter (Signed)
 ERROR

## 2024-08-19 ENCOUNTER — Other Ambulatory Visit: Payer: Self-pay

## 2024-08-19 DIAGNOSIS — Z0000 Encounter for general adult medical examination without abnormal findings: Secondary | ICD-10-CM

## 2024-08-19 DIAGNOSIS — E66811 Obesity, class 1: Secondary | ICD-10-CM

## 2024-08-19 DIAGNOSIS — R7309 Other abnormal glucose: Secondary | ICD-10-CM

## 2024-08-19 DIAGNOSIS — E7800 Pure hypercholesterolemia, unspecified: Secondary | ICD-10-CM

## 2024-08-22 ENCOUNTER — Other Ambulatory Visit

## 2024-08-23 LAB — TSH: TSH: 2.43 m[IU]/L (ref 0.40–4.50)

## 2024-08-23 LAB — CBC WITH DIFFERENTIAL/PLATELET
Absolute Lymphocytes: 2000 {cells}/uL (ref 850–3900)
Absolute Monocytes: 409 {cells}/uL (ref 200–950)
Basophils Absolute: 40 {cells}/uL (ref 0–200)
Basophils Relative: 0.6 %
Eosinophils Absolute: 53 {cells}/uL (ref 15–500)
Eosinophils Relative: 0.8 %
HCT: 45.5 % (ref 39.4–51.1)
Hemoglobin: 15.1 g/dL (ref 13.2–17.1)
MCH: 30.1 pg (ref 27.0–33.0)
MCHC: 33.2 g/dL (ref 31.6–35.4)
MCV: 90.6 fL (ref 81.4–101.7)
MPV: 10.8 fL (ref 7.5–12.5)
Monocytes Relative: 6.2 %
Neutro Abs: 4099 {cells}/uL (ref 1500–7800)
Neutrophils Relative %: 62.1 %
Platelets: 238 Thousand/uL (ref 140–400)
RBC: 5.02 Million/uL (ref 4.20–5.80)
RDW: 12.1 % (ref 11.0–15.0)
Total Lymphocyte: 30.3 %
WBC: 6.6 Thousand/uL (ref 3.8–10.8)

## 2024-08-23 LAB — LIPID PANEL
Cholesterol: 199 mg/dL
HDL: 48 mg/dL
LDL Cholesterol (Calc): 132 mg/dL — ABNORMAL HIGH
Non-HDL Cholesterol (Calc): 151 mg/dL — ABNORMAL HIGH
Total CHOL/HDL Ratio: 4.1 (calc)
Triglycerides: 87 mg/dL

## 2024-08-23 LAB — COMPLETE METABOLIC PANEL WITHOUT GFR
AG Ratio: 1.6 (calc) (ref 1.0–2.5)
ALT: 38 U/L (ref 9–46)
AST: 18 U/L (ref 10–40)
Albumin: 4.6 g/dL (ref 3.6–5.1)
Alkaline phosphatase (APISO): 85 U/L (ref 36–130)
BUN: 14 mg/dL (ref 7–25)
CO2: 26 mmol/L (ref 20–32)
Calcium: 9.1 mg/dL (ref 8.6–10.3)
Chloride: 103 mmol/L (ref 98–110)
Creat: 0.81 mg/dL (ref 0.60–1.26)
Globulin: 2.8 g/dL (ref 1.9–3.7)
Glucose, Bld: 104 mg/dL — ABNORMAL HIGH (ref 65–99)
Potassium: 4.2 mmol/L (ref 3.5–5.3)
Sodium: 137 mmol/L (ref 135–146)
Total Bilirubin: 0.7 mg/dL (ref 0.2–1.2)
Total Protein: 7.4 g/dL (ref 6.1–8.1)

## 2024-08-23 LAB — HEMOGLOBIN A1C
Hgb A1c MFr Bld: 5.2 %
Mean Plasma Glucose: 103 mg/dL
eAG (mmol/L): 5.7 mmol/L

## 2024-08-26 ENCOUNTER — Encounter: Payer: Self-pay | Admitting: Family Medicine

## 2024-08-26 ENCOUNTER — Other Ambulatory Visit: Payer: Self-pay | Admitting: Family Medicine

## 2024-08-26 ENCOUNTER — Ambulatory Visit (INDEPENDENT_AMBULATORY_CARE_PROVIDER_SITE_OTHER): Payer: Self-pay | Admitting: Family Medicine

## 2024-08-26 VITALS — BP 124/82 | HR 69 | Ht 71.0 in | Wt 239.4 lb

## 2024-08-26 DIAGNOSIS — E7800 Pure hypercholesterolemia, unspecified: Secondary | ICD-10-CM

## 2024-08-26 DIAGNOSIS — R7309 Other abnormal glucose: Secondary | ICD-10-CM

## 2024-08-26 DIAGNOSIS — Z0000 Encounter for general adult medical examination without abnormal findings: Secondary | ICD-10-CM | POA: Diagnosis not present

## 2024-08-26 LAB — HEPATITIS B SURFACE ANTIBODY, QUANTITATIVE: Hep B Surface Ab, Qual: 16.48

## 2024-08-26 NOTE — Patient Instructions (Addendum)
 Thank you for coming to the office today.  Lipid Panel     Component Value Date/Time   CHOL 199 08/22/2024 0759   TRIG 87 08/22/2024 0759   HDL 48 08/22/2024 0759   CHOLHDL 4.1 08/22/2024 0759   LDLCALC 132 (H) 08/22/2024 0759   Keep working at lifestyle diet and exercise to reduce the LDL cholesterol.  Focus is low cholesterol diet  Preventing blood sugar, goal is low carb low starch.  Recent Labs    08/22/24 0759  HGBA1C 5.2   DUE for FASTING BLOOD WORK (no food or drink after midnight before the lab appointment, only water or coffee without cream/sugar on the morning of)  SCHEDULE Lab Only visit in the morning at the clinic for lab draw in 6 MONTHS and 1 YEAR  - Make sure Lab Only appointment is at about 1 week before your next appointment, so that results will be available  For Lab Results, once available within 2-3 days of blood draw, you can can log in to MyChart online to view your results and a brief explanation. Also, we can discuss results at next follow-up visit.   Please schedule a Follow-up Appointment to: Return for 1 year fasting lab > 1 week later Annual Physical.  If you have any other questions or concerns, please feel free to call the office or send a message through MyChart. You may also schedule an earlier appointment if necessary.  Additionally, you may be receiving a survey about your experience at our office within a few days to 1 week by e-mail or mail. We value your feedback.  Marsa Officer, DO [facility], [place]

## 2024-08-26 NOTE — Progress Notes (Signed)
 "  Subjective:    Patient ID: Jeffery Krueger, male    DOB: 11-Apr-1991, 34 y.o.   MRN: [id number]  Jeffery Krueger is a 34 y.o. male presenting on 08/26/2024 for Annual Exam   HPI  Discussed the use of AI scribe software for clinical note transcription with the patient, who gave verbal consent to proceed.  History of Present Illness   Jeffery Krueger is a 34 year old male who presents for an annual physical exam.   HYPERLIPIDEMIA: Last lipid panel LDL 132, similar to prior 5 years of results, mild elevated Not on medication - Previously consistent with gym attendance until summer of last year, but has since decreased exercise due to a busy schedule - Diet modifications in past reduced soda alcohol but not adhering as much now goal to resume diet modifications  Impaired Fasting Glucose Normal A1c reading 5.2 Glucose 104 slight elevated in past similar   Obesity BMI >33 Weight up to 239 lbs in past 2 years, gradual weight gain less physical activity exercise and diet emphasis  Impaired Fasting Glucose Glucose 104 A1c 5.2 improved from 5.4  Urinary frequency - Increased urinary frequency, particularly after consuming water or alcohol - No dysuria, urgency, or suprapubic pressure - Frequency more pronounced at home than at work    Environmental Allergies Seasonal   Former Smoker - 02/2019    Health Maintenance: Declined Flu Shot today   Updated Hep B Vaccine Status - Hep B surface antibody lab 11/28/11 - 16.48     08/26/2024    8:11 AM 08/28/2023    8:28 AM 08/22/2022   10:29 AM  Depression screen PHQ 2/9  Decreased Interest 1 1 1   Down, Depressed, Hopeless 0 1 1  PHQ - 2 Score 1 2 2   Altered sleeping 1 0 1  Tired, decreased energy 1 1 2   Change in appetite 0 0 2  Feeling bad or failure about yourself  0 0 2  Trouble concentrating 0 0 2  Moving slowly or fidgety/restless 0 0 1  Suicidal thoughts 0 0 0  PHQ-9 Score 3 3  12    Difficult doing work/chores Not difficult  at all  Very difficult     Data saved with a previous flowsheet row definition       08/26/2024    8:11 AM 08/28/2023    8:28 AM 08/22/2022   10:29 AM 02/28/2022    4:01 PM  GAD 7 : Generalized Anxiety Score  Nervous, Anxious, on Edge 1 0 0 1  Control/stop worrying 0 0 1 1  Worry too much - different things 1 0 1 1  Trouble relaxing 0 0 1 1  Restless 1 0 1 1  Easily annoyed or irritable 1 1 2 1   Afraid - awful might happen 0 0 0 0  Total GAD 7 Score 4 1 6 6   Anxiety Difficulty Not difficult at all  Not difficult at all Not difficult at all     History reviewed. No pertinent past medical history. Past Surgical History:  Procedure Laterality Date   EYE SURGERY     Social History   Socioeconomic History   Marital status: Single    Spouse name: Not on file   Number of children: 1   Years of education: College   Highest education level: Bachelor's degree (e.g., BA, AB, BS)  Occupational History   Occupation: [employer]  Tobacco Use   Smoking status: Former    Current  packs/day: 0.00    Average packs/day: 0.1 packs/day    Types: Cigarettes    Quit date: 02/06/2019    Years since quitting: 5.5   Smokeless tobacco: Former  Building Services Engineer status: Never Used  Substance and Sexual Activity   Alcohol use: Yes    Alcohol/week: 5.0 standard drinks of alcohol    Types: 5 Cans of beer per week   Drug use: Never   Sexual activity: Not on file  Other Topics Concern   Not on file  Social History Narrative   Not on file   Social Drivers of Health   Tobacco Use: Medium Risk (08/26/2024)   Patient History    Smoking Tobacco Use: Former    Smokeless Tobacco Use: Former    Passive Exposure: Not on Stage Manager: Not on Ship Broker Insecurity: Not on file  Transportation Needs: Not on file  Physical Activity: Not on file  Stress: Not on file  Social Connections: Not on file  Intimate Partner Violence: Not At Risk (02/15/2023)   Received from [facility] - [place] , [place] , [place]  and Affiliate Partners   Feeling Safe     Are you in a relationship with someone who hurts you emotionally and/or physically?: No  Depression (PHQ2-9): Low Risk (08/26/2024)   Depression (PHQ2-9)    PHQ-2 Score: 3  Alcohol Screen: Not on file  Housing: Not on file  Utilities: Not on file  Health Literacy: Not on file   Family History  Problem Relation Age of Onset   Prostate cancer Neg Hx    Colon cancer Neg Hx    Diabetes Neg Hx    Current Outpatient Medications on File Prior to Visit  Medication Sig   EPINEPHrine 0.3 mg/0.3 mL IJ SOAJ injection Inject into the muscle.   ipratropium (ATROVENT ) 0.06 % nasal spray Place 2 sprays into both nostrils 4 (four) times daily.   No current facility-administered medications on file prior to visit.    Review of Systems  Constitutional:  Negative for activity change, appetite change, chills, diaphoresis, fatigue and fever.  HENT:  Negative for congestion and hearing loss.   Eyes:  Negative for visual disturbance.  Respiratory:  Negative for cough, chest tightness, shortness of breath and wheezing.   Cardiovascular:  Negative for chest pain, palpitations and leg swelling.  Gastrointestinal:  Negative for abdominal pain, constipation, diarrhea, nausea and vomiting.  Genitourinary:  Negative for dysuria, frequency and hematuria.  Musculoskeletal:  Negative for arthralgias and neck pain.  Skin:  Negative for rash.  Neurological:  Negative for dizziness, weakness, light-headedness, numbness and headaches.  Hematological:  Negative for adenopathy.  Psychiatric/Behavioral:  Negative for behavioral problems, dysphoric mood and sleep disturbance.    Per HPI unless specifically indicated above     Objective:    BP 124/82 (BP Location: Left Arm, Patient Position: Sitting, Cuff Size: Large)   Pulse 69   Ht 5' 11 (1.803 m)   Wt 239 lb 6 oz (108.6 kg)   SpO2 96%   BMI 33.39 kg/m   Wt Readings from Last 3  Encounters:  08/26/24 239 lb 6 oz (108.6 kg)  08/28/23 231 lb (104.8 kg)  10/04/22 232 lb (105.2 kg)    Physical Exam Vitals and nursing note reviewed.  Constitutional:      General: He is not in acute distress.    Appearance: He is well-developed. He is not diaphoretic.     Comments: Well-appearing, comfortable, cooperative  HENT:     Head: Normocephalic and atraumatic.     Right Ear: Tympanic membrane, ear canal and external ear normal. There is no impacted cerumen.     Left Ear: Tympanic membrane, ear canal and external ear normal. There is no impacted cerumen.  Eyes:     General:        Right eye: No discharge.        Left eye: No discharge.     Conjunctiva/sclera: Conjunctivae normal.     Pupils: Pupils are equal, round, and reactive to light.  Neck:     Thyroid: No thyromegaly.     Vascular: No carotid bruit.  Cardiovascular:     Rate and Rhythm: Normal rate and regular rhythm.     Pulses: Normal pulses.     Heart sounds: Normal heart sounds. No murmur heard. Pulmonary:     Effort: Pulmonary effort is normal. No respiratory distress.     Breath sounds: Normal breath sounds. No wheezing or rales.  Abdominal:     General: Bowel sounds are normal. There is no distension.     Palpations: Abdomen is soft. There is no mass.     Tenderness: There is no abdominal tenderness.  Musculoskeletal:        General: No tenderness. Normal range of motion.     Cervical back: Normal range of motion and neck supple.     Right lower leg: No edema.     Left lower leg: No edema.     Comments: Upper / Lower Extremities: - Normal muscle tone, strength bilateral upper extremities 5/5, lower extremities 5/5  Lymphadenopathy:     Cervical: No cervical adenopathy.  Skin:    General: Skin is warm and dry.     Findings: No erythema or rash.  Neurological:     Mental Status: He is alert and oriented to person, place, and time.     Comments: Distal sensation intact to light touch all  extremities  Psychiatric:        Mood and Affect: Mood normal.        Behavior: Behavior normal.        Thought Content: Thought content normal.     Comments: Well groomed, good eye contact, normal speech and thoughts     Results for orders placed or performed in visit on 08/26/24  Hepatitis B surface antibody,quantitative   Collection Time: 11/28/11  8:00 AM  Result Value Ref Range   Hep B Surface Ab, Qual 16.48       Assessment & Plan:   Problem List Items Addressed This Visit   None Visit Diagnoses       Annual physical exam    -  Primary     Elevated LDL cholesterol level            Updated Health Maintenance information Reviewed recent lab results with patient Encouraged improvement to lifestyle with diet and exercise Goal of weight loss   Annual physical examination Routine exam with no significant abnormalities. Immunizations current including Hep B Immunity confirmed on prior lab titer - Scheduled follow-up in one year.  Pure hypercholesterolemia Slightly elevated cholesterol LDL 132 stable over past 5 years, consistent with past results. No immediate cardiovascular risk at age 55 with no other significant risk factors Emphasized lifestyle modifications. - Encouraged diet and exercise. - Scheduled follow-up blood panel in six months. Age 44+ ASCVD calc risk and Coronary Calcium CT offer in future.  Impaired fasting glucose Mild fasting glucose  elevation with normal A1c 5.2 improved.  No immediate concern. - Continue routine glucose monitoring. - Encouraged lifestyle modifications.     Urinary frequency Based on history seems benign or functional related to fluid intake. No clear diagnosis of BPH or other urinary dysfunction Recommend avoid bladder irritants and trial double voiding technique and options to help improve bladder emptying     Orders Placed This Encounter  Procedures   Hepatitis B surface antibody,quantitative    This order was created  through External Result Entry    No orders of the defined types were placed in this encounter.    Follow up plan: Return for 1 year fasting lab > 1 week later Annual Physical.  Future labs with cholesterol + BMET 03/03/25  Marsa Officer, DO [facility] 08/26/2024, 8:13 AM  "

## 2025-03-03 ENCOUNTER — Other Ambulatory Visit

## 2025-08-18 ENCOUNTER — Other Ambulatory Visit

## 2025-08-25 ENCOUNTER — Encounter: Admitting: Family Medicine
